# Patient Record
Sex: Female | Born: 1940 | Race: White | Hispanic: No | Smoking: Never smoker
Health system: Southern US, Community
[De-identification: ages and names within clinical notes are randomized; demographics above are authoritative.]

## PROBLEM LIST (undated history)

## (undated) DIAGNOSIS — K219 Gastro-esophageal reflux disease without esophagitis: Secondary | ICD-10-CM

## (undated) DIAGNOSIS — I10 Essential (primary) hypertension: Secondary | ICD-10-CM

## (undated) DIAGNOSIS — E785 Hyperlipidemia, unspecified: Secondary | ICD-10-CM

## (undated) HISTORY — DX: Gastro-esophageal reflux disease without esophagitis: K21.9

## (undated) HISTORY — PX: ABDOMINAL HYSTERECTOMY: SHX81

## (undated) HISTORY — DX: Essential (primary) hypertension: I10

## (undated) HISTORY — DX: Hyperlipidemia, unspecified: E78.5

---

## 2010-03-01 ENCOUNTER — Ambulatory Visit (HOSPITAL_COMMUNITY): Admission: RE | Admit: 2010-03-01 | Discharge: 2010-03-01 | Payer: Self-pay | Admitting: Ophthalmology

## 2013-01-30 ENCOUNTER — Ambulatory Visit (INDEPENDENT_AMBULATORY_CARE_PROVIDER_SITE_OTHER): Payer: Medicare Other | Admitting: Nurse Practitioner

## 2013-01-30 ENCOUNTER — Encounter: Payer: Self-pay | Admitting: Nurse Practitioner

## 2013-01-30 ENCOUNTER — Telehealth: Payer: Self-pay | Admitting: Nurse Practitioner

## 2013-01-30 VITALS — BP 136/76 | HR 90 | Temp 97.2°F | Ht 64.0 in | Wt 170.0 lb

## 2013-01-30 DIAGNOSIS — H6120 Impacted cerumen, unspecified ear: Secondary | ICD-10-CM

## 2013-01-30 DIAGNOSIS — H6123 Impacted cerumen, bilateral: Secondary | ICD-10-CM

## 2013-01-30 MED ORDER — TRAMADOL HCL 50 MG PO TABS: 50.0000 mg | ORAL_TABLET | Freq: Three times a day (TID) | ORAL | Status: DC | PRN

## 2013-01-30 NOTE — Patient Instructions (Signed)
Cerumen Impaction A cerumen impaction is when the wax in your ear forms a plug. This plug usually causes reduced hearing. Sometimes it also causes an earache or dizziness. Removing a cerumen impaction can be difficult and painful. The wax sticks to the ear canal. The canal is sensitive and bleeds easily. If you try to remove a heavy wax buildup with a cotton tipped swab, you may push it in further. Irrigation with water, suction, and small ear curettes may be used to clear out the wax. If the impaction is fixed to the skin in the ear canal, ear drops may be needed for a few days to loosen the wax. People who build up a lot of wax frequently can use ear wax removal products available in your local drugstore. SEEK MEDICAL CARE IF:  You develop an earache, increased hearing loss, or marked dizziness. Document Released: 09/06/2004 Document Revised: 10/22/2011 Document Reviewed: 10/27/2009 Exit[employer] Patient Information 2014 [employer].  

## 2013-01-30 NOTE — Telephone Encounter (Signed)
appt made

## 2013-01-30 NOTE — Progress Notes (Addendum)
Subjective:    Patient ID: Alyssa Ashley, female    DOB: [birth year], 72 y.o.   MRN: [id number]  HPI Patient in c/o left ear pain- started 2 evenings ago- nothing helps    Review of Systems  Constitutional: Positive for fever.  HENT: Positive for ear pain (left). Negative for congestion, rhinorrhea, sneezing, mouth sores and sinus pressure.   Eyes: Negative.   Respiratory: Negative for cough.   Cardiovascular: Negative.   Gastrointestinal: Negative.   Genitourinary: Negative.   Musculoskeletal: Negative.   Psychiatric/Behavioral: Negative.        Objective:   Physical Exam  Constitutional: She appears well-developed and well-nourished.  HENT:  bil cerumen impaction  Cardiovascular: Normal rate and normal heart sounds.   Pulmonary/Chest: Effort normal and breath sounds normal.  Skin: Skin is warm.  Psychiatric: She has a normal mood and affect. Her behavior is normal. Judgment and thought content normal.     Irrigation attempted without success-m currette used and small amount extracted BP 136/76  Pulse 90  Temp(Src) 97.2 F (36.2 C) (Oral)  Ht 5\' 4"  (1.626 m)  Wt 170 lb (77.111 kg)  BMI 29.17 kg/m2      Assessment & Plan:  bil cerumen impaction  Debrox OTC  RTO Monday to reflush  Ultram 50 mg 1 po Q* prn- #40 0 refills   Mary-Margaret Daphine Deutscher, FNP

## 2013-04-11 ENCOUNTER — Other Ambulatory Visit: Payer: Self-pay | Admitting: Nurse Practitioner

## 2013-09-06 ENCOUNTER — Other Ambulatory Visit: Payer: Self-pay | Admitting: Family Medicine

## 2013-09-08 NOTE — Telephone Encounter (Signed)
ntbs

## 2013-09-08 NOTE — Telephone Encounter (Signed)
Last seen 01/30/13  MMM 

## 2013-10-08 ENCOUNTER — Other Ambulatory Visit: Payer: Self-pay | Admitting: Nurse Practitioner

## 2013-10-10 ENCOUNTER — Other Ambulatory Visit: Payer: Self-pay | Admitting: Nurse Practitioner

## 2013-10-13 NOTE — Telephone Encounter (Signed)
Patient NTBS for follow up and lab work  

## 2013-10-13 NOTE — Telephone Encounter (Signed)
Last seen 01/30/13  MMM 

## 2013-10-14 NOTE — Telephone Encounter (Signed)
Cell / home # disconnected. Pt no longer works at the [phone]. Left message with Foye Spurlingatricia Gann [phone] to have [facility] call to make an appointment

## 2013-11-10 ENCOUNTER — Ambulatory Visit (INDEPENDENT_AMBULATORY_CARE_PROVIDER_SITE_OTHER): Payer: Medicare HMO

## 2013-11-10 ENCOUNTER — Other Ambulatory Visit: Payer: Self-pay | Admitting: Family Medicine

## 2013-11-10 ENCOUNTER — Ambulatory Visit (HOSPITAL_COMMUNITY)
Admission: RE | Admit: 2013-11-10 | Discharge: 2013-11-10 | Disposition: A | Discharge status: Home / Self Care | Payer: Medicare HMO | Source: Ambulatory Visit | Attending: Family Medicine | Admitting: Family Medicine

## 2013-11-10 ENCOUNTER — Ambulatory Visit (INDEPENDENT_AMBULATORY_CARE_PROVIDER_SITE_OTHER): Payer: Medicare HMO | Admitting: Family Medicine

## 2013-11-10 VITALS — BP 136/86 | HR 105 | Temp 97.0°F | Ht 64.0 in | Wt 168.0 lb

## 2013-11-10 DIAGNOSIS — M25569 Pain in unspecified knee: Secondary | ICD-10-CM

## 2013-11-10 DIAGNOSIS — M7120 Synovial cyst of popliteal space [Baker], unspecified knee: Secondary | ICD-10-CM | POA: Insufficient documentation

## 2013-11-10 MED ORDER — LISINOPRIL-HYDROCHLOROTHIAZIDE 10-12.5 MG PO TABS: 1.0000 | ORAL_TABLET | Freq: Every day | ORAL | Status: DC

## 2013-11-10 MED ORDER — TRAMADOL HCL 50 MG PO TABS: 50.0000 mg | ORAL_TABLET | Freq: Three times a day (TID) | ORAL | Status: DC | PRN

## 2013-11-10 MED ORDER — PREDNISONE 50 MG PO TABS: ORAL_TABLET | ORAL | Status: DC

## 2013-11-10 NOTE — Progress Notes (Signed)
Subjective:    Patient ID: Alyssa Ashley, female    DOB: [birth year], 73 y.o.   MRN: [id number]  HPI Knee pain x3-4 days  Woke up over the weekend with generalized knee pain and mild swelling No known injury Prior hx/o knee OA No knee locking or giving away.  Also with some mild popliteal pain  Neurovasculary intact distally     Review of Systems  All other systems reviewed and are negative.       Objective:   Physical Exam  Constitutional: She appears well-developed and well-nourished.  HENT:  Head: Normocephalic and atraumatic.  Eyes: Conjunctivae are normal. Pupils are equal, round, and reactive to light.  Neck: Normal range of motion.  Cardiovascular: Normal rate and regular rhythm.   Pulmonary/Chest: Effort normal.  Abdominal: Soft.  Musculoskeletal:       Legs:    [facility] reading (PRIMARY) by  Dr. Alvester Morin  Preliminary R knee xray with mild joint space narrowing and degenerative changes                                        Assessment & Plan:  Knee pain - Plan: DG Knee 1-2 Views Right, predniSONE (DELTASONE) 50 MG tablet, traMADol (ULTRAM) 50 MG tablet, AMB referral to sports medicine, [place] Misc Soft Tissue   Will place on course of prednisone and ultram LE U/S to rule out DVT/bakers cyst.  Plan for sports medicine follow up for ultrasound guided drainage of knee if needed s/p course of prednisone/pending imaging.  Discussed [facility] red flags.  Follow up as needed.

## 2013-11-10 NOTE — Addendum Note (Signed)
Addended by: Gwenith DailyHUDY, Jericka Kadar N on: 11/10/2013 12:45 PM   Modules accepted: Orders

## 2013-12-07 ENCOUNTER — Encounter: Payer: Self-pay | Admitting: Nurse Practitioner

## 2013-12-07 ENCOUNTER — Ambulatory Visit (INDEPENDENT_AMBULATORY_CARE_PROVIDER_SITE_OTHER): Payer: Medicare HMO | Admitting: Nurse Practitioner

## 2013-12-07 VITALS — BP 125/80 | HR 106 | Temp 97.1°F | Ht 64.0 in | Wt 158.0 lb

## 2013-12-07 DIAGNOSIS — B372 Candidiasis of skin and nail: Secondary | ICD-10-CM

## 2013-12-07 DIAGNOSIS — R209 Unspecified disturbances of skin sensation: Secondary | ICD-10-CM

## 2013-12-07 DIAGNOSIS — I10 Essential (primary) hypertension: Secondary | ICD-10-CM

## 2013-12-07 DIAGNOSIS — R208 Other disturbances of skin sensation: Secondary | ICD-10-CM

## 2013-12-07 DIAGNOSIS — R200 Anesthesia of skin: Secondary | ICD-10-CM

## 2013-12-07 MED ORDER — FLUCONAZOLE 150 MG PO TABS: 150.0000 mg | ORAL_TABLET | Freq: Once | ORAL | Status: DC

## 2013-12-07 MED ORDER — NYSTATIN 100000 UNIT/GM EX CREA: 1.0000 "application " | TOPICAL_CREAM | Freq: Two times a day (BID) | CUTANEOUS | Status: DC

## 2013-12-07 NOTE — Patient Instructions (Signed)

## 2013-12-07 NOTE — Progress Notes (Signed)
Subjective:    Patient ID: Alyssa Ashley, female    DOB: 1941-08-08, 73 y.o.   MRN: [id number]  Hypertension This is a chronic problem. The current episode started more than 1 year ago. The problem is unchanged. The problem is controlled. Pertinent negatives include no blurred vision, chest pain, headaches, palpitations, peripheral edema or shortness of breath. There are no associated agents to hypertension. Risk factors for coronary artery disease include post-menopausal state and sedentary lifestyle. Past treatments include ACE inhibitors and diuretics. The current treatment provides moderate improvement. Compliance problems include diet and exercise.    * C/O vaginal itching- Started a few days ago- no discharge * Patient was walking across living room floor and now foot is numb- not sure what is going on with it. Can't feel it at all when she is walking.   Review of Systems  Constitutional: Negative.   HENT: Negative.   Eyes: Negative for blurred vision.  Respiratory: Negative for shortness of breath.   Cardiovascular: Negative for chest pain and palpitations.  Gastrointestinal: Negative.   Genitourinary: Negative.   Neurological: Negative for facial asymmetry and headaches.  Psychiatric/Behavioral: Negative.         Objective:   Physical Exam  Constitutional: She is oriented to person, place, and time. She appears well-developed and well-nourished.  Cardiovascular: Normal rate, regular rhythm and normal heart sounds.   Pulmonary/Chest: Effort normal and breath sounds normal.  Abdominal: Soft. Bowel sounds are normal.  Genitourinary:  Erythematous external gentalia  Musculoskeletal:  Patient unable to flex foot upward- positive extension of foot against resisitance Unable to feel light touch but able to feel deep touch No foot drop with gait  Neurological: She is alert and oriented to person, place, and time.  Skin: Skin is warm and dry.  Psychiatric: She has a normal mood  and affect. Her behavior is normal. Judgment and thought content normal.   BP 125/80  Pulse 106  Temp(Src) 97.1 F (36.2 C) (Oral)  Ht 5' 4" (1.626 m)  Wt 158 lb (71.668 kg)  BMI 27.11 kg/m2        Assessment & Plan:  1. Hypertension Continue current meds Low NA+ diet - CMP14+EGFR - NMR, lipoprofile  2. Numbness of left foot Keep diary - Ambulatory referral to Neurology  3. Cutaneous candidiasis Avoid scratching No bubble baths - nystatin cream (MYCOSTATIN); Apply 1 application topically 2 (two) times daily.  Dispense: 30 g; Refill: 0 - fluconazole (DIFLUCAN) 150 MG tablet; Take 1 tablet (150 mg total) by mouth once.  Dispense: 1 tablet; Refill: 0   Health maintenance reviewed RTO in 3 months  [place], FNP  Subjective:    Patient ID: Alyssa Ashley, female    DOB: 1941-08-08, 73 y.o.   MRN: [id number]  Hypertension This is a chronic problem. The current episode started more than 1 year ago. The problem is unchanged. The problem is controlled. Pertinent negatives include no blurred vision, chest pain, headaches, palpitations, peripheral edema or shortness of breath. There are no associated agents to hypertension. Risk factors for coronary artery disease include post-menopausal state and sedentary lifestyle. Past treatments include ACE inhibitors and diuretics. The current treatment provides moderate improvement. Compliance problems include diet and exercise.    * C/O vaginal itching- Started a few days ago- no discharge * Patient was walking across living room floor and now foot is numb- not sure what is going on with it. Can't feel it at all when she is walking.   Review of Systems  Constitutional: Negative.   HENT: Negative.   Eyes: Negative for blurred vision.  Respiratory: Negative for shortness of breath.   Cardiovascular: Negative for chest pain and palpitations.  Gastrointestinal: Negative.   Genitourinary: Negative.   Neurological: Negative for facial asymmetry and headaches.  Psychiatric/Behavioral: Negative.         Objective:   Physical Exam  Constitutional: She is oriented to person, place, and time. She appears well-developed and well-nourished.  Cardiovascular: Normal rate, regular rhythm and normal heart sounds.   Pulmonary/Chest: Effort normal and breath sounds normal.  Abdominal: Soft. Bowel sounds are normal.  Genitourinary:  Erythematous external gentalia  Musculoskeletal:  Patient unable to flex foot upward- positive extension of foot against resisitance Unable to feel light touch but able to feel deep touch No foot drop with gait  Neurological: She is alert and oriented to person, place, and time.  Skin: Skin is warm and dry.  Psychiatric: She has a normal mood  and affect. Her behavior is normal. Judgment and thought content normal.   BP 125/80  Pulse 106  Temp(Src) 97.1 F (36.2 C) (Oral)  Ht 5' 4" (1.626 m)  Wt 158 lb (71.668 kg)  BMI 27.11 kg/m2        Assessment & Plan:  1. Hypertension Continue current meds Low NA+ diet - CMP14+EGFR - NMR, lipoprofile  2. Numbness of left foot Keep diary - Ambulatory referral to Neurology  3. Cutaneous candidiasis Avoid scratching No bubble baths - nystatin cream (MYCOSTATIN); Apply 1 application topically 2 (two) times daily.  Dispense: 30 g; Refill: 0 - fluconazole (DIFLUCAN) 150 MG tablet; Take 1 tablet (150 mg total) by mouth once.  Dispense: 1 tablet; Refill: 0   Health maintenance reviewed RTO in 3 months  [place], FNP

## 2013-12-08 LAB — NMR, LIPOPROFILE
CHOLESTEROL: 299 mg/dL — AB (ref <200)
HDL CHOLESTEROL BY NMR: 42 mg/dL (ref >=40)
HDL Particle Number: 40.6 umol/L (ref >=30.5)
LDL PARTICLE NUMBER: 1730 nmol/L — AB (ref <1000)
LDL Size: 19.6 nm (ref >20.5)
LP-IR Score: 73 — ABNORMAL HIGH (ref <=45)
SMALL LDL PARTICLE NUMBER: 1504 nmol/L — AB (ref <=527)
TRIGLYCERIDES BY NMR: 949 mg/dL — AB (ref <150)

## 2013-12-08 LAB — CMP14+EGFR
A/G RATIO: 2 (ref 1.1–2.5)
ALBUMIN: 4.1 g/dL (ref 3.5–4.8)
ALT: 34 IU/L — ABNORMAL HIGH (ref 0–32)
AST: 21 IU/L (ref 0–40)
Alkaline Phosphatase: 94 IU/L (ref 39–117)
BILIRUBIN TOTAL: 0.4 mg/dL (ref 0.0–1.2)
BUN/Creatinine Ratio: 22 (ref 11–26)
BUN: 22 mg/dL (ref 8–27)
CALCIUM: 10.2 mg/dL (ref 8.7–10.3)
CO2: 20 mmol/L (ref 18–29)
CREATININE: 1 mg/dL (ref 0.57–1.00)
Chloride: 79 mmol/L — ABNORMAL LOW (ref 97–108)
GFR, EST AFRICAN AMERICAN: 65 mL/min/{1.73_m2} (ref >59)
GFR, EST NON AFRICAN AMERICAN: 56 mL/min/{1.73_m2} — AB (ref >59)
GLUCOSE: 652 mg/dL — AB (ref 65–99)
Globulin, Total: 2.1 g/dL (ref 1.5–4.5)
POTASSIUM: 5 mmol/L (ref 3.5–5.2)
SODIUM: 122 mmol/L — AB (ref 134–144)
Total Protein: 6.2 g/dL (ref 6.0–8.5)

## 2014-01-11 ENCOUNTER — Telehealth: Payer: Self-pay | Admitting: Nurse Practitioner

## 2014-01-14 ENCOUNTER — Ambulatory Visit (INDEPENDENT_AMBULATORY_CARE_PROVIDER_SITE_OTHER): Payer: Medicare HMO | Admitting: Nurse Practitioner

## 2014-01-14 ENCOUNTER — Telehealth: Payer: Self-pay | Admitting: Nurse Practitioner

## 2014-01-14 ENCOUNTER — Encounter: Payer: Self-pay | Admitting: Nurse Practitioner

## 2014-01-14 VITALS — BP 119/73 | HR 85 | Temp 97.2°F | Ht 64.0 in | Wt 157.6 lb

## 2014-01-14 DIAGNOSIS — IMO0001 Reserved for inherently not codable concepts without codable children: Secondary | ICD-10-CM

## 2014-01-14 DIAGNOSIS — E1165 Type 2 diabetes mellitus with hyperglycemia: Principal | ICD-10-CM

## 2014-01-14 DIAGNOSIS — E119 Type 2 diabetes mellitus without complications: Secondary | ICD-10-CM | POA: Insufficient documentation

## 2014-01-14 DIAGNOSIS — Z09 Encounter for follow-up examination after completed treatment for conditions other than malignant neoplasm: Secondary | ICD-10-CM

## 2014-01-14 LAB — POCT GLYCOSYLATED HEMOGLOBIN (HGB A1C): Hemoglobin A1C: 10

## 2014-01-14 LAB — GLUCOSE, POCT (MANUAL RESULT ENTRY): POC GLUCOSE: 117 mg/dL — AB (ref 70–99)

## 2014-01-14 MED ORDER — METFORMIN HCL 500 MG PO TABS: 1000.0000 mg | ORAL_TABLET | Freq: Two times a day (BID) | ORAL | Status: DC

## 2014-01-14 NOTE — Progress Notes (Signed)
Subjective:    Patient ID: Alyssa Ashley, female    DOB: 1941/06/05, 73 y.o.   MRN: [id number]  HPI Patient was seen on 12/07/13 for reglar follow up- When we got her lab results back her blood sugar was over 600- SHe was immediately called and was sent to the ER- She was kept in the hospital for 3 days- they rx her insulin and metformin 500 2 BID- Patient said that she could not afford insulin and did not get x filled. Has just been taking metformin- Her fasting blood sugars are running 120-130's since coming home from the hospital. [facility] has been watching her diet.    Review of Systems  Constitutional: Negative.   HENT: Negative.   Eyes: Negative.   Respiratory: Negative.   Cardiovascular: Negative.   Gastrointestinal: Negative.   Genitourinary: Negative.   Neurological: Negative.   Psychiatric/Behavioral: Negative.   All other systems reviewed and are negative.      Objective:   Physical Exam  Constitutional: She is oriented to person, place, and time. She appears well-developed and well-nourished.  Cardiovascular: Normal rate, regular rhythm and normal heart sounds.   Pulmonary/Chest: Effort normal and breath sounds normal.  Neurological: She is alert and oriented to person, place, and time.  Skin: Skin is warm and dry.  Psychiatric: She has a normal mood and affect. Her behavior is normal. Judgment and thought content normal.   BP 119/73  Pulse 85  Temp(Src) 97.2 F (36.2 C) (Oral)  Ht 5\' 4"  (1.626 m)  Wt 157 lb 9.6 oz (71.487 kg)  BMI 27.04 kg/m2   Results for orders placed in visit on 01/14/14  POCT GLYCOSYLATED HEMOGLOBIN (HGB A1C)      Result Value Ref Range   Hemoglobin A1C 10.0    GLUCOSE, POCT (MANUAL RESULT ENTRY)      Result Value Ref Range   POC Glucose 117 (*) 70 - 99 mg/dl         Assessment & Plan:   1. Type II or unspecified type diabetes mellitus without mention of complication, uncontrolled   2. Hospital discharge follow-up     Continue  metformin as rx Carb counting RTO in 1 month with blood sugar diary - POCT glycosylated hemoglobin (Hb A1C) - POCT glucose (manual entry) - metFORMIN (GLUCOPHAGE) 500 MG tablet; Take 2 tablets (1,000 mg total) by mouth 2 (two) times daily with a meal.  Dispense: 180 tablet; Refill: 1  Mary-Margaret Daphine Deutscher, FNP

## 2014-01-14 NOTE — Telephone Encounter (Signed)
appt given for today at 3 

## 2014-01-14 NOTE — Telephone Encounter (Signed)
Papers done by Debbi on 01/14/14

## 2014-01-14 NOTE — Patient Instructions (Signed)

## 2014-01-15 ENCOUNTER — Ambulatory Visit: Payer: Medicare HMO | Admitting: Neurology

## 2014-02-22 ENCOUNTER — Ambulatory Visit (INDEPENDENT_AMBULATORY_CARE_PROVIDER_SITE_OTHER): Payer: Medicare HMO | Admitting: Nurse Practitioner

## 2014-02-22 ENCOUNTER — Encounter: Payer: Self-pay | Admitting: Nurse Practitioner

## 2014-02-22 VITALS — BP 122/72 | HR 94 | Temp 98.3°F | Ht 64.0 in | Wt 152.8 lb

## 2014-02-22 DIAGNOSIS — E1165 Type 2 diabetes mellitus with hyperglycemia: Principal | ICD-10-CM

## 2014-02-22 DIAGNOSIS — I10 Essential (primary) hypertension: Secondary | ICD-10-CM

## 2014-02-22 DIAGNOSIS — IMO0001 Reserved for inherently not codable concepts without codable children: Secondary | ICD-10-CM

## 2014-02-22 LAB — POCT GLYCOSYLATED HEMOGLOBIN (HGB A1C): Hemoglobin A1C: 7.7

## 2014-02-22 NOTE — Patient Instructions (Signed)

## 2014-02-22 NOTE — Progress Notes (Signed)
Subjective:    Patient ID: Alyssa Ashley, female    DOB: April 23, 1941, 73 y.o.   MRN: [id number]  Patient here today for follow up of chronic medical problems.   Diabetes Pertinent negatives for hypoglycemia include no headaches. Pertinent negatives for diabetes include no blurred vision and no chest pain.  Hypertension This is a chronic problem. The current episode started more than 1 year ago. The problem is unchanged. The problem is controlled. Pertinent negatives include no blurred vision, chest pain, headaches, palpitations, peripheral edema or shortness of breath. There are no associated agents to hypertension. Risk factors for coronary artery disease include post-menopausal state and sedentary lifestyle. Past treatments include ACE inhibitors and diuretics. The current treatment provides moderate improvement. Compliance problems include diet and exercise.   GERD Patient taking nexium and that is working well for her.   Review of Systems  Constitutional: Negative.   HENT: Negative.   Eyes: Negative for blurred vision.  Respiratory: Negative for shortness of breath.   Cardiovascular: Negative for chest pain and palpitations.  Gastrointestinal: Negative.   Genitourinary: Negative.   Neurological: Negative for facial asymmetry and headaches.  Psychiatric/Behavioral: Negative.         Objective:   Physical Exam  Constitutional: She is oriented to person, place, and time. She appears well-developed and well-nourished.  Cardiovascular: Normal rate, regular rhythm and normal heart sounds.   Pulmonary/Chest: Effort normal and breath sounds normal.  Abdominal: Soft. Bowel sounds are normal.  Genitourinary:  Erythematous external gentalia  Musculoskeletal:  Patient unable to flex foot upward- positive extension of foot against resisitance Unable to feel light touch but able to feel deep touch No foot drop with gait  Neurological: She is alert and oriented to person, place, and  time.  Skin: Skin is warm and dry.  Contusion dorsal surface of right foot- nontender to touch  Psychiatric: She has a normal mood and affect. Her behavior is normal. Judgment and thought content normal.   BP 122/72  Pulse 94  Temp(Src) 98.3 F (36.8 C) (Oral)  Ht 5\' 4"  (1.626 m)  Wt 152 lb 12.8 oz (69.31 kg)  BMI 26.22 kg/m2  Results for orders placed in visit on 02/22/14  POCT GLYCOSYLATED HEMOGLOBIN (HGB A1C)      Result Value Ref Range   Hemoglobin A1C 7.7           Assessment & Plan:   1. Type II or unspecified type diabetes mellitus without mention of complication, uncontrolled   2. Essential hypertension    Orders Placed This Encounter  Procedures  . POCT glycosylated hemoglobin (Hb A1C)   Meds ordered this encounter  Medications  . esomeprazole (NEXIUM) 20 MG capsule    Sig: Take 20 mg by mouth daily at 12 noon.   hgba1c improving- form >10 tp 7.7- will continue current meds and recheck in 3 months Labs pending Health maintenance reviewed Diet and exercise encouraged Continue all meds Follow up  In 3 months   Mary-Margaret Daphine Deutscher, FNP

## 2014-02-23 LAB — CMP14+EGFR
A/G RATIO: 1.8 (ref 1.1–2.5)
ALBUMIN: 4.2 g/dL (ref 3.5–4.8)
ALT: 12 IU/L (ref 0–32)
AST: 11 IU/L (ref 0–40)
Alkaline Phosphatase: 60 IU/L (ref 39–117)
BUN/Creatinine Ratio: 23 (ref 11–26)
BUN: 17 mg/dL (ref 8–27)
CHLORIDE: 100 mmol/L (ref 97–108)
CO2: 23 mmol/L (ref 18–29)
Calcium: 10 mg/dL (ref 8.7–10.3)
Creatinine, Ser: 0.75 mg/dL (ref 0.57–1.00)
GFR calc Af Amer: 91 mL/min/{1.73_m2} (ref >59)
GFR, EST NON AFRICAN AMERICAN: 79 mL/min/{1.73_m2} (ref >59)
Globulin, Total: 2.4 g/dL (ref 1.5–4.5)
Glucose: 117 mg/dL — ABNORMAL HIGH (ref 65–99)
Potassium: 3.5 mmol/L (ref 3.5–5.2)
Sodium: 144 mmol/L (ref 134–144)
Total Protein: 6.6 g/dL (ref 6.0–8.5)

## 2014-02-23 LAB — NMR, LIPOPROFILE
CHOLESTEROL: 166 mg/dL (ref 100–199)
HDL Cholesterol by NMR: 47 mg/dL (ref >39)
HDL PARTICLE NUMBER: 36.6 umol/L (ref >=30.5)
LDL Particle Number: 1107 nmol/L — ABNORMAL HIGH (ref <1000)
LDL Size: 20.1 nm (ref >20.5)
LDLC SERPL CALC-MCNC: 76 mg/dL (ref 0–99)
LP-IR SCORE: 63 — AB (ref <=45)
Small LDL Particle Number: 665 nmol/L — ABNORMAL HIGH (ref <=527)
Triglycerides by NMR: 216 mg/dL — ABNORMAL HIGH (ref 0–149)

## 2014-02-25 ENCOUNTER — Telehealth: Payer: Self-pay | Admitting: *Deleted

## 2014-02-25 NOTE — Telephone Encounter (Signed)
Message copied by Baltazar ApoJOYCE, Rhiley Solem W on Thu Feb 25, 2014 11:11 AM ------      Message from: Bennie PieriniMARTIN, MARY-MARGARET      Created: Tue Feb 23, 2014  8:10 AM       Kidney and liver function stable      ldl particle numbers lok great- trig are improved      Continue current meds- low fat diet and exercise and recheck in 3 months       ------

## 2014-03-02 ENCOUNTER — Encounter: Payer: Self-pay | Admitting: Nurse Practitioner

## 2014-04-15 ENCOUNTER — Telehealth: Payer: Self-pay | Admitting: Nurse Practitioner

## 2014-04-15 ENCOUNTER — Encounter: Payer: Self-pay | Admitting: *Deleted

## 2014-05-04 ENCOUNTER — Other Ambulatory Visit: Payer: Self-pay | Admitting: Nurse Practitioner

## 2014-05-05 ENCOUNTER — Other Ambulatory Visit: Payer: Self-pay | Admitting: Nurse Practitioner

## 2014-05-26 ENCOUNTER — Ambulatory Visit: Payer: Medicare HMO | Admitting: Nurse Practitioner

## 2014-05-27 ENCOUNTER — Telehealth: Payer: Self-pay | Admitting: *Deleted

## 2014-05-27 ENCOUNTER — Encounter: Payer: Self-pay | Admitting: *Deleted

## 2014-05-27 NOTE — Telephone Encounter (Signed)
i need to know hy patient needs in order to do paper work.

## 2014-05-27 NOTE — Telephone Encounter (Signed)
Please discuss knee brace that [facility] says mrs Alyssa Ashley has ordered. If she did in reality order this and you wish her to persue I have the form. Thank you!

## 2014-05-28 ENCOUNTER — Ambulatory Visit: Payer: Medicare HMO | Admitting: Nurse Practitioner

## 2014-06-23 ENCOUNTER — Other Ambulatory Visit: Payer: Self-pay | Admitting: Nurse Practitioner

## 2014-07-01 ENCOUNTER — Ambulatory Visit (INDEPENDENT_AMBULATORY_CARE_PROVIDER_SITE_OTHER): Payer: Medicare HMO | Admitting: Nurse Practitioner

## 2014-07-01 ENCOUNTER — Encounter: Payer: Self-pay | Admitting: Nurse Practitioner

## 2014-07-01 VITALS — BP 123/79 | HR 86 | Temp 97.5°F | Ht 64.0 in | Wt 151.0 lb

## 2014-07-01 DIAGNOSIS — I10 Essential (primary) hypertension: Secondary | ICD-10-CM

## 2014-07-01 DIAGNOSIS — K219 Gastro-esophageal reflux disease without esophagitis: Secondary | ICD-10-CM

## 2014-07-01 DIAGNOSIS — E119 Type 2 diabetes mellitus without complications: Secondary | ICD-10-CM

## 2014-07-01 LAB — POCT GLYCOSYLATED HEMOGLOBIN (HGB A1C): Hemoglobin A1C: 6.7

## 2014-07-01 MED ORDER — LISINOPRIL-HYDROCHLOROTHIAZIDE 10-12.5 MG PO TABS: 1.0000 | ORAL_TABLET | Freq: Every day | ORAL | Status: DC

## 2014-07-01 MED ORDER — METFORMIN HCL 500 MG PO TABS: ORAL_TABLET | ORAL | Status: DC

## 2014-07-01 MED ORDER — ESOMEPRAZOLE MAGNESIUM 20 MG PO CPDR: 20.0000 mg | DELAYED_RELEASE_CAPSULE | Freq: Every day | ORAL | Status: DC

## 2014-07-01 NOTE — Patient Instructions (Signed)

## 2014-07-01 NOTE — Progress Notes (Signed)
Subjective:    Patient ID: Royetta Crochet, female    DOB: Jun 11, 1941, 73 y.o.   MRN: [id number]  Patient here today for follow up of chronic medical problems.   Diabetes She presents for her follow-up diabetic visit. She has type 2 diabetes mellitus. No MedicAlert identification noted. There are no hypoglycemic associated symptoms. Pertinent negatives for hypoglycemia include no headaches. Associated symptoms include foot paresthesias. Pertinent negatives for diabetes include no chest pain, no polydipsia, no polyphagia and no polyuria. There are no hypoglycemic complications. There are no diabetic complications. Risk factors for coronary artery disease include diabetes mellitus, hypertension and post-menopausal. Current diabetic treatment includes oral agent (monotherapy). She is compliant with treatment most of the time. Her weight is stable. She is following a diabetic diet. When asked about meal planning, she reported none. She has not had a previous visit with a dietitian. She rarely participates in exercise. Her breakfast blood glucose is taken between 8-9 am. Her breakfast blood glucose range is generally 110-130 mg/dl. Her overall blood glucose range is 110-130 mg/dl. An ACE inhibitor/angiotensin II receptor blocker is being taken. She does not see a podiatrist.Eye exam is not current.  Hypertension This is a chronic problem. The current episode started more than 1 year ago. The problem is unchanged. The problem is controlled. Pertinent negatives include no chest pain, headaches, palpitations or shortness of breath. Risk factors for coronary artery disease include diabetes mellitus and post-menopausal state. Past treatments include ACE inhibitors and diuretics. The current treatment provides significant improvement. Compliance problems include diet and exercise.  Hypertensive end-organ damage includes CAD/MI.  GERD Patient taking nexium and that is working well for her.   Review of Systems    Constitutional: Negative.   HENT: Negative.   Respiratory: Negative for shortness of breath.   Cardiovascular: Negative for chest pain and palpitations.  Gastrointestinal: Negative.   Endocrine: Negative for polydipsia, polyphagia and polyuria.  Genitourinary: Negative.   Neurological: Negative for facial asymmetry and headaches.  Psychiatric/Behavioral: Negative.         Objective:   Physical Exam  Constitutional: She is oriented to person, place, and time. She appears well-developed and well-nourished.  HENT:  Nose: Nose normal.  Mouth/Throat: Oropharynx is clear and moist.  Eyes: EOM are normal.  Neck: Trachea normal, normal range of motion and full passive range of motion without pain. Neck supple. No JVD present. Carotid bruit is not present. No thyromegaly present.  Cardiovascular: Normal rate, regular rhythm, normal heart sounds and intact distal pulses.  Exam reveals no gallop and no friction rub.   No murmur heard. Pulmonary/Chest: Effort normal and breath sounds normal.  Abdominal: Soft. Bowel sounds are normal. She exhibits no distension and no mass. There is no tenderness.  Musculoskeletal: Normal range of motion.  Lymphadenopathy:    She has no cervical adenopathy.  Neurological: She is alert and oriented to person, place, and time. She has normal reflexes.  Skin: Skin is warm and dry.  Psychiatric: She has a normal mood and affect. Her behavior is normal. Judgment and thought content normal.   BP 123/79 mmHg  Pulse 86  Temp(Src) 97.5 F (36.4 C) (Oral)  Ht 5\' 4"  (1.626 m)  Wt 151 lb (68.493 kg)  BMI 25.91 kg/m2  Results for orders placed or performed in visit on 07/01/14  POCT glycosylated hemoglobin (Hb A1C)  Result Value Ref Range   Hemoglobin A1C 6.7%          Assessment & Plan:  1. Essential hypertension Do not add salt to diet - CMP14+EGFR - NMR, lipoprofile - lisinopril-hydrochlorothiazide (PRINZIDE,ZESTORETIC) 10-12.5 MG per tablet; Take  1 tablet by mouth daily.  Dispense: 90 tablet; Refill: 1  2. Type 2 diabetes mellitus without complication Watch carbs - POCT glycosylated hemoglobin (Hb A1C) - metFORMIN (GLUCOPHAGE) 500 MG tablet; TAKE TWO TABLETS BY MOUTH TWICE DAILY WITH MEALS  Dispense: 180 tablet; Refill: 1  3. Gastroesophageal reflux disease without esophagitis Avoid spicy foods Do  Not eat 2 hours prior to bedtime - esomeprazole (NEXIUM) 20 MG capsule; Take 1 capsule (20 mg total) by mouth daily at 12 noon.  Dispense: 90 capsule; Refill: 1   Refuses all immunizations Labs pending Health maintenance reviewed Diet and exercise encouraged Continue all meds Follow up  In 3 months   Mary-Margaret Daphine Deutscher, FNP

## 2014-07-02 LAB — CMP14+EGFR
A/G RATIO: 1.9 (ref 1.1–2.5)
ALK PHOS: 63 IU/L (ref 39–117)
ALT: 8 IU/L (ref 0–32)
AST: 8 IU/L (ref 0–40)
Albumin: 4.3 g/dL (ref 3.5–4.8)
BILIRUBIN TOTAL: 0.2 mg/dL (ref 0.0–1.2)
BUN / CREAT RATIO: 18 (ref 11–26)
BUN: 12 mg/dL (ref 8–27)
CO2: 26 mmol/L (ref 18–29)
Calcium: 9.4 mg/dL (ref 8.7–10.3)
Chloride: 98 mmol/L (ref 97–108)
Creatinine, Ser: 0.67 mg/dL (ref 0.57–1.00)
GFR, EST AFRICAN AMERICAN: 100 mL/min/{1.73_m2} (ref >59)
GFR, EST NON AFRICAN AMERICAN: 87 mL/min/{1.73_m2} (ref >59)
Globulin, Total: 2.3 g/dL (ref 1.5–4.5)
Glucose: 105 mg/dL — ABNORMAL HIGH (ref 65–99)
Potassium: 4.2 mmol/L (ref 3.5–5.2)
Sodium: 139 mmol/L (ref 134–144)
Total Protein: 6.6 g/dL (ref 6.0–8.5)

## 2014-07-02 LAB — NMR, LIPOPROFILE
CHOLESTEROL: 202 mg/dL — AB (ref 100–199)
HDL CHOLESTEROL BY NMR: 50 mg/dL (ref >39)
HDL Particle Number: 34.9 umol/L (ref >=30.5)
LDL Particle Number: 1477 nmol/L — ABNORMAL HIGH (ref <1000)
LDL Size: 20.4 nm (ref >20.5)
LDL-C: 110 mg/dL — ABNORMAL HIGH (ref 0–99)
LP-IR Score: 63 — ABNORMAL HIGH (ref <=45)
SMALL LDL PARTICLE NUMBER: 779 nmol/L — AB (ref <=527)
TRIGLYCERIDES BY NMR: 209 mg/dL — AB (ref 0–149)

## 2014-07-06 ENCOUNTER — Telehealth: Payer: Self-pay | Admitting: Nurse Practitioner

## 2014-07-06 NOTE — Telephone Encounter (Signed)
Aware of lab results.  Does not want to take cholesterol medication.

## 2014-07-06 NOTE — Telephone Encounter (Signed)
-----   Message from Bennie PieriniMary-Margaret Martin, FNP sent at 07/05/2014  1:19 PM EST ----- Hgba1c discussed at appointment Kidney and liver function stable LDL are increasing- needs to be on a statin- will Take? Continue current meds- low fat diet and exercise and recheck in 3 months

## 2014-12-20 ENCOUNTER — Other Ambulatory Visit: Payer: Self-pay | Admitting: Nurse Practitioner

## 2015-01-05 ENCOUNTER — Encounter: Payer: Self-pay | Admitting: Nurse Practitioner

## 2015-01-05 ENCOUNTER — Ambulatory Visit (INDEPENDENT_AMBULATORY_CARE_PROVIDER_SITE_OTHER): Payer: Commercial Managed Care - HMO | Admitting: Nurse Practitioner

## 2015-01-05 VITALS — BP 123/80 | HR 85 | Temp 97.1°F | Ht 64.0 in | Wt 156.0 lb

## 2015-01-05 DIAGNOSIS — E119 Type 2 diabetes mellitus without complications: Secondary | ICD-10-CM | POA: Diagnosis not present

## 2015-01-05 DIAGNOSIS — K219 Gastro-esophageal reflux disease without esophagitis: Secondary | ICD-10-CM | POA: Diagnosis not present

## 2015-01-05 DIAGNOSIS — I10 Essential (primary) hypertension: Secondary | ICD-10-CM | POA: Diagnosis not present

## 2015-01-05 LAB — POCT GLYCOSYLATED HEMOGLOBIN (HGB A1C): Hemoglobin A1C: 6.8

## 2015-01-05 MED ORDER — LISINOPRIL-HYDROCHLOROTHIAZIDE 10-12.5 MG PO TABS: 1.0000 | ORAL_TABLET | Freq: Every day | ORAL | Status: DC

## 2015-01-05 MED ORDER — METFORMIN HCL 500 MG PO TABS: ORAL_TABLET | ORAL | Status: DC

## 2015-01-05 MED ORDER — ESOMEPRAZOLE MAGNESIUM 20 MG PO CPDR: 20.0000 mg | DELAYED_RELEASE_CAPSULE | Freq: Every day | ORAL | Status: DC

## 2015-01-05 NOTE — Addendum Note (Signed)
Addended by: Tommas OlpHANDY, Brondon Wann N on: 01/05/2015 11:23 AM   Modules accepted: Orders

## 2015-01-05 NOTE — Patient Instructions (Signed)

## 2015-01-05 NOTE — Progress Notes (Signed)
Subjective:    Patient ID: Alyssa Ashley, female    DOB: [birth year], 74 y.o.   MRN: [id number]  Patient here today for follow up of chronic medical problems.   Hypertension This is a chronic problem. The current episode started more than 1 year ago. The problem is unchanged. The problem is controlled. Pertinent negatives include no chest pain, headaches, palpitations or shortness of breath. Risk factors for coronary artery disease include diabetes mellitus and post-menopausal state. Past treatments include ACE inhibitors and diuretics. The current treatment provides significant improvement. Compliance problems include diet and exercise.  Hypertensive end-organ damage includes CAD/MI.  Diabetes She presents for her follow-up diabetic visit. She has type 2 diabetes mellitus. No MedicAlert identification noted. There are no hypoglycemic associated symptoms. Pertinent negatives for hypoglycemia include no headaches. Associated symptoms include foot paresthesias. Pertinent negatives for diabetes include no chest pain, no polydipsia, no polyphagia and no polyuria. There are no hypoglycemic complications. There are no diabetic complications. Risk factors for coronary artery disease include diabetes mellitus, hypertension and post-menopausal. Current diabetic treatment includes oral agent (monotherapy). She is compliant with treatment most of the time. Her weight is stable. She is following a diabetic diet. When asked about meal planning, she reported none. She has not had a previous visit with a dietitian. She rarely participates in exercise. Her breakfast blood glucose is taken between 8-9 am. Her breakfast blood glucose range is generally 110-130 mg/dl. Her overall blood glucose range is 110-130 mg/dl. An ACE inhibitor/angiotensin II receptor blocker is being taken. She does not see a podiatrist.Eye exam is not current.  GERD Patient taking nexium and that is working well for her.   Review of Systems    Constitutional: Negative.   HENT: Negative.   Respiratory: Negative for shortness of breath.   Cardiovascular: Negative for chest pain and palpitations.  Gastrointestinal: Negative.   Endocrine: Negative for polydipsia, polyphagia and polyuria.  Genitourinary: Negative.   Neurological: Negative for facial asymmetry and headaches.  Psychiatric/Behavioral: Negative.         Objective:   Physical Exam  Constitutional: She is oriented to person, place, and time. She appears well-developed and well-nourished.  HENT:  Nose: Nose normal.  Mouth/Throat: Oropharynx is clear and moist.  Eyes: EOM are normal.  Neck: Trachea normal, normal range of motion and full passive range of motion without pain. Neck supple. No JVD present. Carotid bruit is not present. No thyromegaly present.  Cardiovascular: Normal rate, regular rhythm, normal heart sounds and intact distal pulses.  Exam reveals no gallop and no friction rub.   No murmur heard. Pulmonary/Chest: Effort normal and breath sounds normal.  Abdominal: Soft. Bowel sounds are normal. She exhibits no distension and no mass. There is no tenderness.  Musculoskeletal: Normal range of motion.  Lymphadenopathy:    She has no cervical adenopathy.  Neurological: She is alert and oriented to person, place, and time. She has normal reflexes.  Skin: Skin is warm and dry.  Psychiatric: She has a normal mood and affect. Her behavior is normal. Judgment and thought content normal.   BP 123/80 mmHg  Pulse 85  Temp(Src) 97.1 F (36.2 C) (Oral)  Ht 5\' 4"  (1.626 m)  Wt 156 lb (70.761 kg)  BMI 26.76 kg/m2  Results for orders placed or performed in visit on 01/05/15  POCT glycosylated hemoglobin (Hb A1C)  Result Value Ref Range   Hemoglobin A1C 6.8          Assessment & Plan:  1. Essential hypertension Do not add salt to diet - POCT glycosylated hemoglobin (Hb A1C) - CMP14+EGFR - Microalbumin, urine - lisinopril-hydrochlorothiazide  (PRINZIDE,ZESTORETIC) 10-12.5 MG per tablet; Take 1 tablet by mouth daily.  Dispense: 90 tablet; Refill: 1  2. Gastroesophageal reflux disease without esophagitis Avoid spicy foods Do not eat 2 hours prior to bedtime  - esomeprazole (NEXIUM) 20 MG capsule; Take 1 capsule (20 mg total) by mouth daily at 12 noon.  Dispense: 90 capsule; Refill: 1  3. Type 2 diabetes mellitus without complication Continue to watch carbs in diet - metFORMIN (GLUCOPHAGE) 500 MG tablet; TAKE TWO TABLETS BY MOUTH TWICE DAILY WITH MEALS  Dispense: 180 tablet; Refill: 1   Refuses EKG and chest x ray Labs pending Health maintenance reviewed- refuses all preventative  Diet and exercise encouraged Continue all meds Follow up  In 3 month   Mary-Margaret Daphine Deutscher, FNP

## 2015-01-06 LAB — CMP14+EGFR
A/G RATIO: 1.7 (ref 1.1–2.5)
ALBUMIN: 4 g/dL (ref 3.5–4.8)
ALK PHOS: 62 IU/L (ref 39–117)
ALT: 11 IU/L (ref 0–32)
AST: 13 IU/L (ref 0–40)
BILIRUBIN TOTAL: 0.2 mg/dL (ref 0.0–1.2)
BUN/Creatinine Ratio: 20 (ref 11–26)
BUN: 16 mg/dL (ref 8–27)
CALCIUM: 9.6 mg/dL (ref 8.7–10.3)
CHLORIDE: 100 mmol/L (ref 97–108)
CO2: 21 mmol/L (ref 18–29)
CREATININE: 0.79 mg/dL (ref 0.57–1.00)
GFR, EST AFRICAN AMERICAN: 85 mL/min/{1.73_m2} (ref >59)
GFR, EST NON AFRICAN AMERICAN: 74 mL/min/{1.73_m2} (ref >59)
GLOBULIN, TOTAL: 2.3 g/dL (ref 1.5–4.5)
Glucose: 126 mg/dL — ABNORMAL HIGH (ref 65–99)
Potassium: 4.4 mmol/L (ref 3.5–5.2)
SODIUM: 141 mmol/L (ref 134–144)
TOTAL PROTEIN: 6.3 g/dL (ref 6.0–8.5)

## 2015-03-09 DIAGNOSIS — H2513 Age-related nuclear cataract, bilateral: Secondary | ICD-10-CM | POA: Diagnosis not present

## 2015-03-09 DIAGNOSIS — E119 Type 2 diabetes mellitus without complications: Secondary | ICD-10-CM | POA: Diagnosis not present

## 2015-03-09 LAB — HM DIABETES EYE EXAM

## 2015-03-14 DIAGNOSIS — H4011X3 Primary open-angle glaucoma, severe stage: Secondary | ICD-10-CM | POA: Diagnosis not present

## 2015-03-28 DIAGNOSIS — H4011X3 Primary open-angle glaucoma, severe stage: Secondary | ICD-10-CM | POA: Diagnosis not present

## 2015-04-14 ENCOUNTER — Encounter: Payer: Self-pay | Admitting: *Deleted

## 2015-06-17 ENCOUNTER — Other Ambulatory Visit: Payer: Self-pay | Admitting: Nurse Practitioner

## 2015-06-24 ENCOUNTER — Ambulatory Visit (INDEPENDENT_AMBULATORY_CARE_PROVIDER_SITE_OTHER): Payer: Commercial Managed Care - HMO | Admitting: Family

## 2015-06-24 ENCOUNTER — Encounter: Payer: Self-pay | Admitting: Family

## 2015-06-24 ENCOUNTER — Ambulatory Visit: Payer: Commercial Managed Care - HMO | Admitting: Pediatrics

## 2015-06-24 VITALS — BP 120/74 | HR 101 | Temp 98.4°F | Ht 64.0 in | Wt 157.4 lb

## 2015-06-24 DIAGNOSIS — J069 Acute upper respiratory infection, unspecified: Secondary | ICD-10-CM | POA: Diagnosis not present

## 2015-06-24 MED ORDER — HYDROCODONE-HOMATROPINE 5-1.5 MG/5ML PO SYRP: 5.0000 mL | ORAL_SOLUTION | Freq: Three times a day (TID) | ORAL | Status: DC | PRN

## 2015-06-24 MED ORDER — FLUTICASONE PROPIONATE 50 MCG/ACT NA SUSP: 2.0000 | Freq: Every day | NASAL | Status: DC

## 2015-06-24 MED ORDER — METHYLPREDNISOLONE 4 MG PO TBPK: ORAL_TABLET | ORAL | Status: DC

## 2015-06-24 MED ORDER — BENZONATATE 200 MG PO CAPS: 200.0000 mg | ORAL_CAPSULE | Freq: Three times a day (TID) | ORAL | Status: DC | PRN

## 2015-06-24 NOTE — Progress Notes (Signed)
Subjective:    Patient ID: Alyssa Ashley, female    DOB: [birth year], 74 y.o.   MRN: [id number]  Cough This is a new problem. The current episode started 1 to 4 weeks ago. The problem has been gradually worsening. The problem occurs every few minutes. The cough is productive of purulent sputum. Associated symptoms include chills, a fever, headaches, myalgias, nasal congestion, postnasal drip, rhinorrhea and a sore throat. Pertinent negatives include no ear congestion, ear pain or shortness of breath. The symptoms are aggravated by lying down. She has tried OTC cough suppressant for the symptoms. The treatment provided moderate relief. There is no history of asthma or COPD.  Sore Throat  Associated symptoms include coughing and headaches. Pertinent negatives include no ear pain or shortness of breath.      Review of Systems  Constitutional: Positive for fever and chills.  HENT: Positive for postnasal drip, rhinorrhea and sore throat. Negative for ear pain.   Eyes: Negative.   Respiratory: Positive for cough. Negative for shortness of breath.   Cardiovascular: Negative.  Negative for palpitations.  Gastrointestinal: Negative.   Endocrine: Negative.   Genitourinary: Negative.   Musculoskeletal: Positive for myalgias.  Neurological: Positive for headaches.  Hematological: Negative.   Psychiatric/Behavioral: Negative.   All other systems reviewed and are negative.      Objective:   Physical Exam  Constitutional: She is oriented to person, place, and time. She appears well-developed and well-nourished. No distress.  HENT:  Head: Normocephalic and atraumatic.  Right Ear: External ear normal.  Left Ear: External ear normal.  Mouth/Throat: Oropharynx is clear and moist.  Eyes: Pupils are equal, round, and reactive to light.  Neck: Normal range of motion. Neck supple. No thyromegaly present.  Cardiovascular: Normal rate, regular rhythm, normal heart sounds and intact distal pulses.   No  murmur heard. Pulmonary/Chest: Effort normal and breath sounds normal. No respiratory distress. She has no wheezes.  Abdominal: Soft. Bowel sounds are normal. She exhibits no distension. There is no tenderness.  Musculoskeletal: Normal range of motion. She exhibits no edema or tenderness.  Neurological: She is alert and oriented to person, place, and time. She has normal reflexes. No cranial nerve deficit.  Skin: Skin is warm and dry.  Psychiatric: She has a normal mood and affect. Her behavior is normal. Judgment and thought content normal.  Vitals reviewed.     BP 120/74 mmHg  Pulse 101  Temp(Src) 98.4 F (36.9 C) (Oral)  Ht 5\' 4"  (1.626 m)  Wt 157 lb 6.4 oz (71.396 kg)  BMI 27.00 kg/m2     Assessment & Plan:  1. Acute upper respiratory infection - Take meds as prescribed - Use a cool mist humidifier  -Use saline nose sprays frequently -Saline irrigations of the nose can be very helpful if done frequently.  * 4X daily for 1 week*  * Use of a nettie pot can be helpful with this. Follow directions with this* -Force fluids -For any cough or congestion  Use plain Mucinex- regular strength or max strength is fine   * Children- consult with Pharmacist for dosing -For fever or aces or pains- take tylenol or ibuprofen appropriate for age and weight.  * for fevers greater than 101 orally you may alternate ibuprofen and tylenol every  3 hours. -Throat lozenges if help -Low carb diet- Pt states blood glucose levels are WNL - benzonatate (TESSALON) 200 MG capsule; Take 1 capsule (200 mg total) by mouth 3 (three) times daily as  needed.  Dispense: 30 capsule; Refill: 1 - fluticasone (FLONASE) 50 MCG/ACT nasal spray; Place 2 sprays into both nostrils daily.  Dispense: 16 g; Refill: 6 - HYDROcodone-homatropine (HYCODAN) 5-1.5 MG/5ML syrup; Take 5 mLs by mouth every 8 (eight) hours as needed for cough.  Dispense: 120 mL; Refill: 0 - methylPREDNISolone (MEDROL DOSEPAK) 4 MG TBPK tablet; Use  as directed  Dispense: 21 tablet; Refill: 0  Jannifer Rodney, FNP

## 2015-06-24 NOTE — Patient Instructions (Signed)
Upper Respiratory Infection, Adult Most upper respiratory infections (URIs) are a viral infection of the air passages leading to the lungs. A URI affects the nose, throat, and upper air passages. The most common type of URI is nasopharyngitis and is typically referred to as "the common cold." URIs run their course and usually go away on their own. Most of the time, a URI does not require medical attention, but sometimes a bacterial infection in the upper airways can follow a viral infection. This is called a secondary infection. Sinus and middle ear infections are common types of secondary upper respiratory infections. Bacterial pneumonia can also complicate a URI. A URI can worsen asthma and chronic obstructive pulmonary disease (COPD). Sometimes, these complications can require emergency medical care and may be life threatening.  CAUSES Almost all URIs are caused by viruses. A virus is a type of germ and can spread from one person to another.  RISKS FACTORS You may be at risk for a URI if:   You smoke.   You have chronic heart or lung disease.  You have a weakened defense (immune) system.   You are very young or very old.   You have nasal allergies or asthma.  You work in crowded or poorly ventilated areas.  You work in health care facilities or schools. SIGNS AND SYMPTOMS  Symptoms typically develop 2-3 days after you come in contact with a cold virus. Most viral URIs last 7-10 days. However, viral URIs from the influenza virus (flu virus) can last 14-18 days and are typically more severe. Symptoms may include:   Runny or stuffy (congested) nose.   Sneezing.   Cough.   Sore throat.   Headache.   Fatigue.   Fever.   Loss of appetite.   Pain in your forehead, behind your eyes, and over your cheekbones (sinus pain).  Muscle aches.  DIAGNOSIS  Your health care provider may diagnose a URI by:  Physical exam.  Tests to check that your symptoms are not due to  another condition such as:  Strep throat.  Sinusitis.  Pneumonia.  Asthma. TREATMENT  A URI goes away on its own with time. It cannot be cured with medicines, but medicines may be prescribed or recommended to relieve symptoms. Medicines may help:  Reduce your fever.  Reduce your cough.  Relieve nasal congestion. HOME CARE INSTRUCTIONS   Take medicines only as directed by your health care provider.   Gargle warm saltwater or take cough drops to comfort your throat as directed by your health care provider.  Use a warm mist humidifier or inhale steam from a shower to increase air moisture. This may make it easier to breathe.  Drink enough fluid to keep your urine clear or pale yellow.   Eat soups and other clear broths and maintain good nutrition.   Rest as needed.   Return to work when your temperature has returned to normal or as your health care provider advises. You may need to stay home longer to avoid infecting others. You can also use a face mask and careful hand washing to prevent spread of the virus.  Increase the usage of your inhaler if you have asthma.   Do not use any tobacco products, including cigarettes, chewing tobacco, or electronic cigarettes. If you need help quitting, ask your health care provider. PREVENTION  The best way to protect yourself from getting a cold is to practice good hygiene.   Avoid oral or hand contact with people with cold   symptoms.   Wash your hands often if contact occurs.  There is no clear evidence that vitamin C, vitamin E, echinacea, or exercise reduces the chance of developing a cold. However, it is always recommended to get plenty of rest, exercise, and practice good nutrition.  SEEK MEDICAL CARE IF:   You are getting worse rather than better.   Your symptoms are not controlled by medicine.   You have chills.  You have worsening shortness of breath.  You have brown or red mucus.  You have yellow or brown nasal  discharge.  You have pain in your face, especially when you bend forward.  You have a fever.  You have swollen neck glands.  You have pain while swallowing.  You have white areas in the back of your throat. SEEK IMMEDIATE MEDICAL CARE IF:   You have severe or persistent:  Headache.  Ear pain.  Sinus pain.  Chest pain.  You have chronic lung disease and any of the following:  Wheezing.  Prolonged cough.  Coughing up blood.  A change in your usual mucus.  You have a stiff neck.  You have changes in your:  Vision.  Hearing.  Thinking.  Mood. MAKE SURE YOU:   Understand these instructions.  Will watch your condition.  Will get help right away if you are not doing well or get worse.   This information is not intended to replace advice given to you by your health care provider. Make sure you discuss any questions you have with your health care provider.   Document Released: 01/23/2001 Document Revised: 12/14/2014 Document Reviewed: 11/04/2013 Elsevier Interactive Patient Education 2016 [employer]  - Take meds as prescribed - Use a cool mist humidifier  -Use saline nose sprays frequently -Saline irrigations of the nose can be very helpful if done frequently.  * 4X daily for 1 week*  * Use of a nettie pot can be helpful with this. Follow directions with this* -Force fluids -For any cough or congestion  Use plain Mucinex- regular strength or max strength is fine   * Children- consult with Pharmacist for dosing -For fever or aces or pains- take tylenol or ibuprofen appropriate for age and weight.  * for fevers greater than 101 orally you may alternate ibuprofen and tylenol every  3 hours. -Throat lozenges if help   Christy Hawks, FNP   

## 2015-06-27 ENCOUNTER — Ambulatory Visit (INDEPENDENT_AMBULATORY_CARE_PROVIDER_SITE_OTHER): Payer: Commercial Managed Care - HMO

## 2015-06-27 ENCOUNTER — Encounter: Payer: Self-pay | Admitting: Nurse Practitioner

## 2015-06-27 ENCOUNTER — Ambulatory Visit (INDEPENDENT_AMBULATORY_CARE_PROVIDER_SITE_OTHER): Payer: Commercial Managed Care - HMO | Admitting: Nurse Practitioner

## 2015-06-27 VITALS — BP 133/76 | HR 112 | Temp 96.9°F | Ht 64.0 in | Wt 156.0 lb

## 2015-06-27 DIAGNOSIS — E119 Type 2 diabetes mellitus without complications: Secondary | ICD-10-CM | POA: Diagnosis not present

## 2015-06-27 DIAGNOSIS — Z87891 Personal history of nicotine dependence: Secondary | ICD-10-CM

## 2015-06-27 DIAGNOSIS — Z6826 Body mass index (BMI) 26.0-26.9, adult: Secondary | ICD-10-CM | POA: Diagnosis not present

## 2015-06-27 DIAGNOSIS — I10 Essential (primary) hypertension: Secondary | ICD-10-CM

## 2015-06-27 DIAGNOSIS — K219 Gastro-esophageal reflux disease without esophagitis: Secondary | ICD-10-CM | POA: Diagnosis not present

## 2015-06-27 DIAGNOSIS — Z720 Tobacco use: Secondary | ICD-10-CM

## 2015-06-27 LAB — POCT GLYCOSYLATED HEMOGLOBIN (HGB A1C): HEMOGLOBIN A1C: 7.1

## 2015-06-27 MED ORDER — METFORMIN HCL 500 MG PO TABS: ORAL_TABLET | ORAL | Status: DC

## 2015-06-27 MED ORDER — ESOMEPRAZOLE MAGNESIUM 20 MG PO CPDR: 20.0000 mg | DELAYED_RELEASE_CAPSULE | Freq: Every day | ORAL | Status: DC

## 2015-06-27 MED ORDER — LISINOPRIL-HYDROCHLOROTHIAZIDE 10-12.5 MG PO TABS: 1.0000 | ORAL_TABLET | Freq: Every day | ORAL | Status: DC

## 2015-06-27 NOTE — Progress Notes (Signed)
Subjective:    Patient ID: Alyssa Ashley, female    DOB: [birth year], 74 y.o.   MRN: [id number]  Patient here today for follow up of chronic medical problems.   Hypertension This is a chronic problem. The current episode started more than 1 year ago. The problem is unchanged. The problem is controlled. Pertinent negatives include no chest pain, headaches, palpitations or shortness of breath. Risk factors for coronary artery disease include diabetes mellitus and post-menopausal state. Past treatments include ACE inhibitors and diuretics. The current treatment provides significant improvement. Compliance problems include diet and exercise.  Hypertensive end-organ damage includes CAD/MI.  Diabetes She presents for her follow-up diabetic visit. She has type 2 diabetes mellitus. No MedicAlert identification noted. There are no hypoglycemic associated symptoms. Pertinent negatives for hypoglycemia include no headaches. Associated symptoms include foot paresthesias. Pertinent negatives for diabetes include no chest pain, no polydipsia, no polyphagia and no polyuria. There are no hypoglycemic complications. There are no diabetic complications. Risk factors for coronary artery disease include diabetes mellitus, hypertension and post-menopausal. Current diabetic treatment includes oral agent (monotherapy). She is compliant with treatment most of the time. Her weight is stable. She is following a diabetic diet. When asked about meal planning, she reported none. She has not had a previous visit with a dietitian. She rarely participates in exercise. Her breakfast blood glucose is taken between 8-9 am. Her breakfast blood glucose range is generally 110-130 mg/dl. Her overall blood glucose range is 110-130 mg/dl. An ACE inhibitor/angiotensin II receptor blocker is being taken. She does not see a podiatrist.Eye exam is not current.  GERD Patient taking nexium and that is working well for her.   Review of Systems    Constitutional: Negative.   HENT: Negative.   Respiratory: Negative for shortness of breath.   Cardiovascular: Negative for chest pain and palpitations.  Gastrointestinal: Negative.   Endocrine: Negative for polydipsia, polyphagia and polyuria.  Genitourinary: Negative.   Neurological: Negative for facial asymmetry and headaches.  Psychiatric/Behavioral: Negative.         Objective:   Physical Exam  Constitutional: She is oriented to person, place, and time. She appears well-developed and well-nourished.  HENT:  Nose: Nose normal.  Mouth/Throat: Oropharynx is clear and moist.  Eyes: EOM are normal.  Neck: Trachea normal, normal range of motion and full passive range of motion without pain. Neck supple. No JVD present. Carotid bruit is not present. No thyromegaly present.  Cardiovascular: Normal rate, regular rhythm, normal heart sounds and intact distal pulses.  Exam reveals no gallop and no friction rub.   No murmur heard. Pulmonary/Chest: Effort normal and breath sounds normal.  Abdominal: Soft. Bowel sounds are normal. She exhibits no distension and no mass. There is no tenderness.  Musculoskeletal: Normal range of motion.  Lymphadenopathy:    She has no cervical adenopathy.  Neurological: She is alert and oriented to person, place, and time. She has normal reflexes.  Skin: Skin is warm and dry.  Psychiatric: She has a normal mood and affect. Her behavior is normal. Judgment and thought content normal.   BP 133/76 mmHg  Pulse 112  Temp(Src) 96.9 F (36.1 C) (Oral)  Ht 5\' 4"  (1.626 m)  Wt 156 lb (70.761 kg)  BMI 26.76 kg/m2   Results for orders placed or performed in visit on 06/27/15  POCT glycosylated hemoglobin (Hb A1C)  Result Value Ref Range   Hemoglobin A1C 7.1    Chest x ray- no cardiopulmonary abnormalities noted- Preliminary reading  by Paulene Floor, FNP  [facility] EKG- [facility]-Mary-Margaret Daphine Deutscher, FNP      Assessment & Plan:   1. Essential hypertension Do not  add salt to diet - CMP14+EGFR - lisinopril-hydrochlorothiazide (PRINZIDE,ZESTORETIC) 10-12.5 MG tablet; Take 1 tablet by mouth daily.  Dispense: 90 tablet; Refill: 1  2. Type 2 diabetes mellitus without complication, without long-term current use of insulin (HCC) Continue to watch carbs in diet - POCT glycosylated hemoglobin (Hb A1C) - Lipid panel - Microalbumin, urine - metFORMIN (GLUCOPHAGE) 500 MG tablet; TAKE TWO TABLETS BY MOUTH TWICE DAILY WITH MEALS  Dispense: 180 tablet; Refill: 1  3. Gastroesophageal reflux disease without esophagitis Avoid spicy foods Do not eat 2 hours prior to bedtime - esomeprazole (NEXIUM) 20 MG capsule; Take 1 capsule (20 mg total) by mouth daily at 12 noon.  Dispense: 90 capsule; Refill: 1  4. BMI 26.0-26.9,adult Discussed diet and exercise for person with BMI >25 Will recheck weight in 3-6 months     Labs pending Health maintenance reviewed Diet and exercise encouraged Continue all meds Follow up  In 3 months   Mary-Margaret Daphine Deutscher, FNP

## 2015-06-27 NOTE — Addendum Note (Signed)
Addended by: Tommas OlpHANDY, Kidada Ging N on: 06/27/2015 11:01 AM   Modules accepted: Orders

## 2015-06-27 NOTE — Patient Instructions (Signed)
Fall Prevention in the Home  Falls can cause injuries and can affect people from all age groups. There are many simple things that you can do to make your home safe and to help prevent falls. WHAT CAN I DO ON THE OUTSIDE OF MY HOME?  Regularly repair the edges of walkways and driveways and fix any cracks.  Remove high doorway thresholds.  Trim any shrubbery on the main path into your home.  Use bright outdoor lighting.  Clear walkways of debris and clutter, including tools and rocks.  Regularly check that handrails are securely fastened and in good repair. Both sides of any steps should have handrails.  Install guardrails along the edges of any raised decks or porches.  Have leaves, snow, and ice cleared regularly.  Use sand or salt on walkways during winter months.  In the garage, clean up any spills right away, including grease or oil spills. WHAT CAN I DO IN THE BATHROOM?  Use night lights.  Install grab bars by the toilet and in the tub and shower. Do not use towel bars as grab bars.  Use non-skid mats or decals on the floor of the tub or shower.  If you need to sit down while you are in the shower, use a plastic, non-slip stool..  Keep the floor dry. Immediately clean up any water that spills on the floor.  Remove soap buildup in the tub or shower on a regular basis.  Attach bath mats securely with double-sided non-slip rug tape.  Remove throw rugs and other tripping hazards from the floor. WHAT CAN I DO IN THE BEDROOM?  Use night lights.  Make sure that a bedside light is easy to reach.  Do not use oversized bedding that drapes onto the floor.  Have a firm chair that has side arms to use for getting dressed.  Remove throw rugs and other tripping hazards from the floor. WHAT CAN I DO IN THE KITCHEN?   Clean up any spills right away.  Avoid walking on wet floors.  Place frequently used items in easy-to-reach places.  If you need to reach for something  above you, use a sturdy step stool that has a grab bar.  Keep electrical cables out of the way.  Do not use floor polish or wax that makes floors slippery. If you have to use wax, make sure that it is non-skid floor wax.  Remove throw rugs and other tripping hazards from the floor. WHAT CAN I DO IN THE STAIRWAYS?  Do not leave any items on the stairs.  Make sure that there are handrails on both sides of the stairs. Fix handrails that are broken or loose. Make sure that handrails are as long as the stairways.  Check any carpeting to make sure that it is firmly attached to the stairs. Fix any carpet that is loose or worn.  Avoid having throw rugs at the top or bottom of stairways, or secure the rugs with carpet tape to prevent them from moving.  Make sure that you have a light switch at the top of the stairs and the bottom of the stairs. If you do not have them, have them installed. WHAT ARE SOME OTHER FALL PREVENTION TIPS?  Wear closed-toe shoes that fit well and support your feet. Wear shoes that have rubber soles or low heels.  When you use a stepladder, make sure that it is completely opened and that the sides are firmly locked. Have someone hold the ladder while you   are using it. Do not climb a closed stepladder.  Add color or contrast paint or tape to grab bars and handrails in your home. Place contrasting color strips on the first and last steps.  Use mobility aids as needed, such as canes, walkers, scooters, and crutches.  Turn on lights if it is dark. Replace any light bulbs that burn out.  Set up furniture so that there are clear paths. Keep the furniture in the same spot.  Fix any uneven floor surfaces.  Choose a carpet design that does not hide the edge of steps of a stairway.  Be aware of any and all pets.  Review your medicines with your healthcare provider. Some medicines can cause dizziness or changes in blood pressure, which increase your risk of falling. Talk  with your health care provider about other ways that you can decrease your risk of falls. This may include working with a physical therapist or trainer to improve your strength, balance, and endurance.   This information is not intended to replace advice given to you by your health care provider. Make sure you discuss any questions you have with your health care provider.   Document Released: 07/20/2002 Document Revised: 12/14/2014 Document Reviewed: 09/03/2014 Elsevier Interactive Patient Education 2016 [employer]  

## 2015-06-28 LAB — CMP14+EGFR
ALT: 14 IU/L (ref 0–32)
AST: 13 IU/L (ref 0–40)
Albumin/Globulin Ratio: 1.6 (ref 1.1–2.5)
Albumin: 4.2 g/dL (ref 3.5–4.8)
Alkaline Phosphatase: 102 IU/L (ref 39–117)
BUN/Creatinine Ratio: 33 — ABNORMAL HIGH (ref 11–26)
BUN: 28 mg/dL — AB (ref 8–27)
Bilirubin Total: <0.2 mg/dL (ref 0.0–1.2)
CALCIUM: 9.9 mg/dL (ref 8.7–10.3)
CHLORIDE: 100 mmol/L (ref 97–106)
CO2: 24 mmol/L (ref 18–29)
Creatinine, Ser: 0.84 mg/dL (ref 0.57–1.00)
GFR, EST AFRICAN AMERICAN: 79 mL/min/{1.73_m2} (ref >59)
GFR, EST NON AFRICAN AMERICAN: 68 mL/min/{1.73_m2} (ref >59)
GLUCOSE: 158 mg/dL — AB (ref 65–99)
Globulin, Total: 2.7 g/dL (ref 1.5–4.5)
Potassium: 3.9 mmol/L (ref 3.5–5.2)
Sodium: 142 mmol/L (ref 136–144)
TOTAL PROTEIN: 6.9 g/dL (ref 6.0–8.5)

## 2015-06-28 LAB — LIPID PANEL
CHOL/HDL RATIO: 3.2 ratio (ref 0.0–4.4)
Cholesterol, Total: 159 mg/dL (ref 100–199)
HDL: 50 mg/dL (ref >39)
LDL Calculated: 65 mg/dL (ref 0–99)
TRIGLYCERIDES: 219 mg/dL — AB (ref 0–149)
VLDL CHOLESTEROL CAL: 44 mg/dL — AB (ref 5–40)

## 2015-06-30 ENCOUNTER — Encounter: Payer: Self-pay | Admitting: *Deleted

## 2015-07-10 DIAGNOSIS — E119 Type 2 diabetes mellitus without complications: Secondary | ICD-10-CM | POA: Diagnosis not present

## 2015-07-10 DIAGNOSIS — Z79899 Other long term (current) drug therapy: Secondary | ICD-10-CM | POA: Diagnosis not present

## 2015-07-10 DIAGNOSIS — Z7984 Long term (current) use of oral hypoglycemic drugs: Secondary | ICD-10-CM | POA: Diagnosis not present

## 2015-07-10 DIAGNOSIS — M19031 Primary osteoarthritis, right wrist: Secondary | ICD-10-CM | POA: Diagnosis not present

## 2015-07-10 DIAGNOSIS — Z882 Allergy status to sulfonamides status: Secondary | ICD-10-CM | POA: Diagnosis not present

## 2015-07-10 DIAGNOSIS — Z794 Long term (current) use of insulin: Secondary | ICD-10-CM | POA: Diagnosis not present

## 2015-07-10 DIAGNOSIS — Z888 Allergy status to other drugs, medicaments and biological substances status: Secondary | ICD-10-CM | POA: Diagnosis not present

## 2015-07-10 DIAGNOSIS — M7989 Other specified soft tissue disorders: Secondary | ICD-10-CM | POA: Diagnosis not present

## 2015-07-10 DIAGNOSIS — I10 Essential (primary) hypertension: Secondary | ICD-10-CM | POA: Diagnosis not present

## 2015-07-18 ENCOUNTER — Ambulatory Visit: Payer: Commercial Managed Care - HMO | Admitting: Family Medicine

## 2015-07-19 ENCOUNTER — Encounter: Payer: Self-pay | Admitting: Family Medicine

## 2015-07-19 ENCOUNTER — Ambulatory Visit (INDEPENDENT_AMBULATORY_CARE_PROVIDER_SITE_OTHER): Payer: Commercial Managed Care - HMO

## 2015-07-19 ENCOUNTER — Telehealth: Payer: Self-pay | Admitting: Family Medicine

## 2015-07-19 ENCOUNTER — Ambulatory Visit (INDEPENDENT_AMBULATORY_CARE_PROVIDER_SITE_OTHER): Payer: Commercial Managed Care - HMO | Admitting: Family Medicine

## 2015-07-19 VITALS — BP 110/71 | HR 92 | Temp 96.8°F | Ht 64.0 in | Wt 152.8 lb

## 2015-07-19 DIAGNOSIS — M25531 Pain in right wrist: Secondary | ICD-10-CM

## 2015-07-19 MED ORDER — TRAMADOL HCL 50 MG PO TABS: 50.0000 mg | ORAL_TABLET | Freq: Three times a day (TID) | ORAL | Status: DC | PRN

## 2015-07-19 NOTE — Patient Instructions (Signed)
Great to meet you!  Wear the wrist splint until we get back to you about your x ray results officially, then you can take it off and gently move it.   Try ice 10 to 15 minutes 2-3 times daily  Try tramadol for pain  We are arranging a referral to orthopedics.

## 2015-07-19 NOTE — Progress Notes (Signed)
   HPI  Patient presents today today for right wrist pain.  Patient explains that [facility] about [facility] weeks ago for this wrist pain. She had 3 days of symptoms at that time  She describes redness and swelling of the right wrist, x-rays were performed that day showed no acute fracture, but advanced arthritis as well as osteopenia.  Since that time she's been using hydrocodone for pain which has helped but not consistently, it does make her sleepy as well.  He denies any trauma or inciting events of the right wrist.  She denies any history of rheumatoid arthritis.    PMH: Smoking status noted ROS: Per HPI  Objective: BP 110/71 mmHg  Pulse 92  Temp(Src) 96.8 F (36 C) (Oral)  Ht 5\' 4"  (1.626 m)  Wt 152 lb 12.8 oz (69.31 kg)  BMI 26.22 kg/m2 Gen: NAD, alert, cooperative with exam HEENT: NCAT CV: RRR, good S1/S2, no murmur Resp: CTABL, no wheezes, non-labored Ext: No edema, warm Neuro: Alert and oriented, No gross deficits  Musculoskeletal: Right wrist swollen compared to the left, she does have tenderness diffusely around the wrist as well as the anatomic snuffbox, some pain with passive range of motion No significant redness or warmth She is wearing a thumb spica wrist splint  Also note that 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  Orders Placed This Encounter  Procedures  . DG Wrist Complete Right    Standing Status: Future     Number of Occurrences: 1     Standing Expiration Date: 09/18/2016    Order Specific Question:  Reason for Exam (SYMPTOM  OR DIAGNOSIS REQUIRED)    Answer:  R wrist pain    Order Specific Question:  Preferred imaging location?    Answer:  Internal  . Sedimentation rate  . ANA  . Rheumatoid factor  . AMB referral to orthopedics    Referral Priority:  Routine    Referral Type:  Consultation    Number of Visits Requested:  1    Meds ordered this encounter  Medications  . traMADol (ULTRAM) 50 MG tablet    Sig: Take 1 tablet (50 mg total) by mouth every 8 (eight) hours as needed.    Dispense:  60 tablet    Refill:  0    Murtis SinkSam Cristalle Rohm, MD [facility] 07/19/2015, 5:08 PM

## 2015-07-20 ENCOUNTER — Other Ambulatory Visit: Payer: Self-pay | Admitting: Family Medicine

## 2015-07-20 LAB — RHEUMATOID FACTOR: Rhuematoid fact SerPl-aCnc: 13.5 IU/mL (ref 0.0–13.9)

## 2015-07-20 LAB — ANA: ANA: POSITIVE — AB

## 2015-07-20 LAB — SEDIMENTATION RATE: SED RATE: 58 mm/h — AB (ref 0–40)

## 2015-07-20 MED ORDER — PREDNISONE 20 MG PO TABS: 40.0000 mg | ORAL_TABLET | Freq: Every day | ORAL | Status: DC

## 2015-07-20 NOTE — Telephone Encounter (Signed)
See result note  Short steroid course for possible inflammatory arthritis.   Elevated ESR and + ANA, negative RF. All nonspecific but given her constellation of symptoms it is reasonable to try.   If she has a good response I will send her to rheum for a more thorough lab work up.   Sam Bradshaw, MD [facility] 07/20/2015, 3:05 PM    

## 2015-07-20 NOTE — Telephone Encounter (Signed)
Result note sent to the pool.   Murtis SinkSam Aubrianne Molyneux, MD [facility] 07/20/2015, 12:21 PM     Her wrist xray does have severe arthritis but no broken bones.  She can come out of the splint and gently move the wrist, she can still wear the splint intermittently for comfort.  Labs pending.

## 2015-07-20 NOTE — Telephone Encounter (Signed)
Patient would like us to call daughter Alyssa Ashley with results [phone] when available.

## 2015-07-21 ENCOUNTER — Telehealth: Payer: Self-pay | Admitting: Family Medicine

## 2015-07-21 NOTE — Telephone Encounter (Signed)
Pt has 2 open calls.

## 2015-07-21 NOTE — Telephone Encounter (Signed)
Returning your call. °

## 2015-08-01 ENCOUNTER — Telehealth: Payer: Self-pay | Admitting: Nurse Practitioner

## 2015-08-01 NOTE — Telephone Encounter (Signed)
Pt aware of md feedback. Pt voiced understanding

## 2015-08-01 NOTE — Telephone Encounter (Signed)
No stronger meds without follow up, howevwer I wanted her to see ortho.   Looks like they haven't been able to contact her. [facility]   Murtis SinkSam Desirre Eickhoff, MD [facility] 08/01/2015, 10:37 AM

## 2015-08-02 ENCOUNTER — Telehealth: Payer: Self-pay | Admitting: Nurse Practitioner

## 2015-08-03 NOTE — Telephone Encounter (Signed)
Per Ermalinda MemosBradshaw patient needs a follow up appointment

## 2015-08-03 NOTE — Telephone Encounter (Signed)
Spoke with Alyssa SpurlingPatricia [facility] and we got an earlier appt for Friday, Dec 23 @ 3:00 to see Dr. Darrelyn HillockGioffre and referral is good.

## 2015-08-05 DIAGNOSIS — M19041 Primary osteoarthritis, right hand: Secondary | ICD-10-CM | POA: Diagnosis not present

## 2015-10-04 ENCOUNTER — Encounter: Payer: Self-pay | Admitting: Nurse Practitioner

## 2015-10-04 ENCOUNTER — Ambulatory Visit: Payer: Commercial Managed Care - HMO | Admitting: Nurse Practitioner

## 2015-10-04 ENCOUNTER — Ambulatory Visit (INDEPENDENT_AMBULATORY_CARE_PROVIDER_SITE_OTHER): Payer: Commercial Managed Care - HMO | Admitting: Nurse Practitioner

## 2015-10-04 VITALS — BP 130/82 | HR 88 | Temp 97.0°F | Ht 64.0 in | Wt 158.4 lb

## 2015-10-04 DIAGNOSIS — Z1212 Encounter for screening for malignant neoplasm of rectum: Secondary | ICD-10-CM | POA: Diagnosis not present

## 2015-10-04 DIAGNOSIS — K219 Gastro-esophageal reflux disease without esophagitis: Secondary | ICD-10-CM | POA: Diagnosis not present

## 2015-10-04 DIAGNOSIS — E119 Type 2 diabetes mellitus without complications: Secondary | ICD-10-CM | POA: Diagnosis not present

## 2015-10-04 DIAGNOSIS — I10 Essential (primary) hypertension: Secondary | ICD-10-CM | POA: Diagnosis not present

## 2015-10-04 LAB — POCT GLYCOSYLATED HEMOGLOBIN (HGB A1C): Hemoglobin A1C: 7.4

## 2015-10-04 MED ORDER — ESOMEPRAZOLE MAGNESIUM 20 MG PO CPDR: 20.0000 mg | DELAYED_RELEASE_CAPSULE | Freq: Every day | ORAL | Status: DC

## 2015-10-04 MED ORDER — METFORMIN HCL 500 MG PO TABS: ORAL_TABLET | ORAL | Status: DC

## 2015-10-04 MED ORDER — LISINOPRIL-HYDROCHLOROTHIAZIDE 10-12.5 MG PO TABS: 1.0000 | ORAL_TABLET | Freq: Every day | ORAL | Status: DC

## 2015-10-04 NOTE — Progress Notes (Signed)
Subjective:    Patient ID: Alyssa Ashley, female    DOB: 09/19/1940, 75 y.o.   MRN: [id number]  HPI    Review of Systems     Objective:   Physical Exam        Assessment & Plan:     Subjective:    Patient ID: Alyssa Ashley, female    DOB: 1941-04-13, 75 y.o.   MRN: [id number]  Patient here today for follow up of chronic medical problems.  Outpatient Encounter Prescriptions as of 10/04/2015  Medication Sig  . esomeprazole (NEXIUM) 20 MG capsule Take 1 capsule (20 mg total) by mouth daily at 12 noon.  Marland Kitchen lisinopril-hydrochlorothiazide (PRINZIDE,ZESTORETIC) 10-12.5 MG tablet Take 1 tablet by mouth daily.  . metFORMIN (GLUCOPHAGE) 500 MG tablet TAKE TWO TABLETS BY MOUTH TWICE DAILY WITH MEALS    Hypertension This is a chronic problem. The current episode started more than 1 year ago. The problem is unchanged. The problem is controlled. Pertinent negatives include no chest pain, headaches, palpitations or shortness of breath. Risk factors for coronary artery disease include diabetes mellitus and post-menopausal state. Past treatments include ACE inhibitors and diuretics. The current treatment provides significant improvement. Compliance problems include diet and exercise.  Hypertensive end-organ damage includes CAD/MI.  Diabetes She presents for her follow-up diabetic visit. She has type 2 diabetes mellitus. No MedicAlert identification noted. There are no hypoglycemic associated symptoms. Pertinent negatives for hypoglycemia include no headaches. Associated symptoms include foot paresthesias. Pertinent negatives for diabetes include no chest pain, no polydipsia, no polyphagia and no polyuria. There are no hypoglycemic complications. There are no diabetic complications. Risk factors for coronary artery disease include diabetes mellitus, hypertension and post-menopausal. Current diabetic treatment includes oral agent (monotherapy). She is compliant with treatment most of the time. Her  weight is stable. She is following a diabetic diet. When asked about meal planning, she reported none. She has not had a previous visit with a dietitian. She rarely participates in exercise. Her breakfast blood glucose is taken between 8-9 am. Her breakfast blood glucose range is generally 110-130 mg/dl. Her overall blood glucose range is 110-130 mg/dl. An ACE inhibitor/angiotensin II receptor blocker is being taken. She does not see a podiatrist.Eye exam is not current.  GERD Patient taking nexium and that is working well for her.   Review of Systems  Constitutional: Negative.   HENT: Negative.   Respiratory: Negative for shortness of breath.   Cardiovascular: Negative for chest pain and palpitations.  Gastrointestinal: Negative.   Endocrine: Negative for polydipsia, polyphagia and polyuria.  Genitourinary: Negative.   Neurological: Negative for facial asymmetry and headaches.  Psychiatric/Behavioral: Negative.         Objective:   Physical Exam  Constitutional: She is oriented to person, place, and time. She appears well-developed and well-nourished.  HENT:  Nose: Nose normal.  Mouth/Throat: Oropharynx is clear and moist.  Eyes: EOM are normal.  Neck: Trachea normal, normal range of motion and full passive range of motion without pain. Neck supple. No JVD present. Carotid bruit is not present. No thyromegaly present.  Cardiovascular: Normal rate, regular rhythm, normal heart sounds and intact distal pulses.  Exam reveals no gallop and no friction rub.   No murmur heard. Pulmonary/Chest: Effort normal and breath sounds normal.  Abdominal: Soft. Bowel sounds are normal. She exhibits no distension and no mass. There is no tenderness.  Musculoskeletal: Normal range of motion.  Lymphadenopathy:    She has no cervical adenopathy.  Neurological:  She is alert and oriented to person, place, and time. She has normal reflexes.  Skin: Skin is warm and dry.  Psychiatric: She has a normal  mood and affect. Her behavior is normal. Judgment and thought content normal.   BP 130/82 mmHg  Pulse 88  Temp(Src) 97 F (36.1 C) (Oral)  Ht 5\' 4"  (1.626 m)  Wt 158 lb 6.4 oz (71.85 kg)  BMI 27.18 kg/m2   Results for orders placed or performed in visit on 10/04/15  POCT glycosylated hemoglobin (Hb A1C)  Result Value Ref Range   Hemoglobin A1C 7.4         Assessment & Plan:  1. Type 2 diabetes mellitus without complication, without long-term current use of insulin (HCC) Stricter carb counting - POCT glycosylated hemoglobin (Hb A1C) - metFORMIN (GLUCOPHAGE) 500 MG tablet; TAKE TWO TABLETS BY MOUTH TWICE DAILY WITH MEALS  Dispense: 180 tablet; Refill: 1  2. Essential hypertension Do not add salt to diet - lisinopril-hydrochlorothiazide (PRINZIDE,ZESTORETIC) 10-12.5 MG tablet; Take 1 tablet by mouth daily.  Dispense: 90 tablet; Refill: 1  3. Gastroesophageal reflux disease without esophagitis Avoid spicy foods Do not eat 2 hours prior to bedtime - esomeprazole (NEXIUM) 20 MG capsule; Take 1 capsule (20 mg total) by mouth daily at 12 noon.  Dispense: 90 capsule; Refill: 1    Labs pending Health maintenance reviewed Diet and exercise encouraged Continue all meds Follow up  In 3 months   Mary-Margaret Daphine Deutscher, FNP

## 2015-10-04 NOTE — Patient Instructions (Signed)
Diabetes and Foot Care Diabetes may cause you to have problems because of poor blood supply (circulation) to your feet and legs. This may cause the skin on your feet to become thinner, break easier, and heal more slowly. Your skin may become dry, and the skin may peel and crack. You may also have nerve damage in your legs and feet causing decreased feeling in them. You may not notice minor injuries to your feet that could lead to infections or more serious problems. Taking care of your feet is one of the most important things you can do for yourself.  HOME CARE INSTRUCTIONS  Wear shoes at all times, even in the house. Do not go barefoot. Bare feet are easily injured.  Check your feet daily for blisters, cuts, and redness. If you cannot see the bottom of your feet, use a mirror or ask someone for help.  Wash your feet with warm water (do not use hot water) and mild soap. Then pat your feet and the areas between your toes until they are completely dry. Do not soak your feet as this can dry your skin.  Apply a moisturizing lotion or petroleum jelly (that does not contain alcohol and is unscented) to the skin on your feet and to dry, brittle toenails. Do not apply lotion between your toes.  Trim your toenails straight across. Do not dig under them or around the cuticle. File the edges of your nails with an emery board or nail file.  Do not cut corns or calluses or try to remove them with medicine.  Wear clean socks or stockings every day. Make sure they are not too tight. Do not wear knee-high stockings since they may decrease blood flow to your legs.  Wear shoes that fit properly and have enough cushioning. To break in new shoes, wear them for just a few hours a day. This prevents you from injuring your feet. Always look in your shoes before you put them on to be sure there are no objects inside.  Do not cross your legs. This may decrease the blood flow to your feet.  If you find a minor scrape,  cut, or break in the skin on your feet, keep it and the skin around it clean and dry. These areas may be cleansed with mild soap and water. Do not cleanse the area with peroxide, alcohol, or iodine.  When you remove an adhesive bandage, be sure not to damage the skin around it.  If you have a wound, look at it several times a day to make sure it is healing.  Do not use heating pads or hot water bottles. They may burn your skin. If you have lost feeling in your feet or legs, you may not know it is happening until it is too late.  Make sure your health care provider performs a complete foot exam at least annually or more often if you have foot problems. Report any cuts, sores, or bruises to your health care provider immediately. SEEK MEDICAL CARE IF:   You have an injury that is not healing.  You have cuts or breaks in the skin.  You have an ingrown nail.  You notice redness on your legs or feet.  You feel burning or tingling in your legs or feet.  You have pain or cramps in your legs and feet.  Your legs or feet are numb.  Your feet always feel cold. SEEK IMMEDIATE MEDICAL CARE IF:   There is increasing redness,   swelling, or pain in or around a wound.  There is a red line that goes up your leg.  Pus is coming from a wound.  You develop a fever or as directed by your health care provider.  You notice a bad smell coming from an ulcer or wound.   This information is not intended to replace advice given to you by your health care provider. Make sure you discuss any questions you have with your health care provider.   Document Released: 07/27/2000 Document Revised: 04/01/2013 Document Reviewed: 01/06/2013 Elsevier Interactive Patient Education 2016 [employer] Inc[employer]  

## 2015-10-04 NOTE — Addendum Note (Signed)
Addended by: Bennie Pierini on: 10/04/2015 04:54 PM   Modules accepted: Orders, SmartSet

## 2015-10-05 LAB — CMP14+EGFR
ALK PHOS: 69 IU/L (ref 39–117)
ALT: 12 IU/L (ref 0–32)
AST: 14 IU/L (ref 0–40)
Albumin/Globulin Ratio: 1.8 (ref 1.1–2.5)
Albumin: 4.2 g/dL (ref 3.5–4.8)
BUN/Creatinine Ratio: 22 (ref 11–26)
BUN: 16 mg/dL (ref 8–27)
Bilirubin Total: <0.2 mg/dL (ref 0.0–1.2)
CALCIUM: 9.6 mg/dL (ref 8.7–10.3)
CO2: 25 mmol/L (ref 18–29)
CREATININE: 0.72 mg/dL (ref 0.57–1.00)
Chloride: 97 mmol/L (ref 96–106)
GFR calc Af Amer: 95 mL/min/{1.73_m2} (ref >59)
GFR, EST NON AFRICAN AMERICAN: 82 mL/min/{1.73_m2} (ref >59)
GLOBULIN, TOTAL: 2.4 g/dL (ref 1.5–4.5)
GLUCOSE: 106 mg/dL — AB (ref 65–99)
Potassium: 4.4 mmol/L (ref 3.5–5.2)
SODIUM: 139 mmol/L (ref 134–144)
Total Protein: 6.6 g/dL (ref 6.0–8.5)

## 2015-10-05 LAB — LIPID PANEL
CHOL/HDL RATIO: 4 ratio (ref 0.0–4.4)
CHOLESTEROL TOTAL: 210 mg/dL — AB (ref 100–199)
HDL: 53 mg/dL (ref >39)
LDL CALC: 90 mg/dL (ref 0–99)
TRIGLYCERIDES: 335 mg/dL — AB (ref 0–149)
VLDL CHOLESTEROL CAL: 67 mg/dL — AB (ref 5–40)

## 2016-05-21 IMAGING — CR DG CHEST 2V
2 series · 2 of 2 positions shown · non-contrast
Comparison: Report of a chest x-ray dated December 08, 2013.

CLINICAL DATA: Cough and cold symptoms for the past week, history
of hypertension, hyperlipidemia, and smoking.

EXAM:
CHEST  2 VIEW

[view not recorded (1 of 2)]
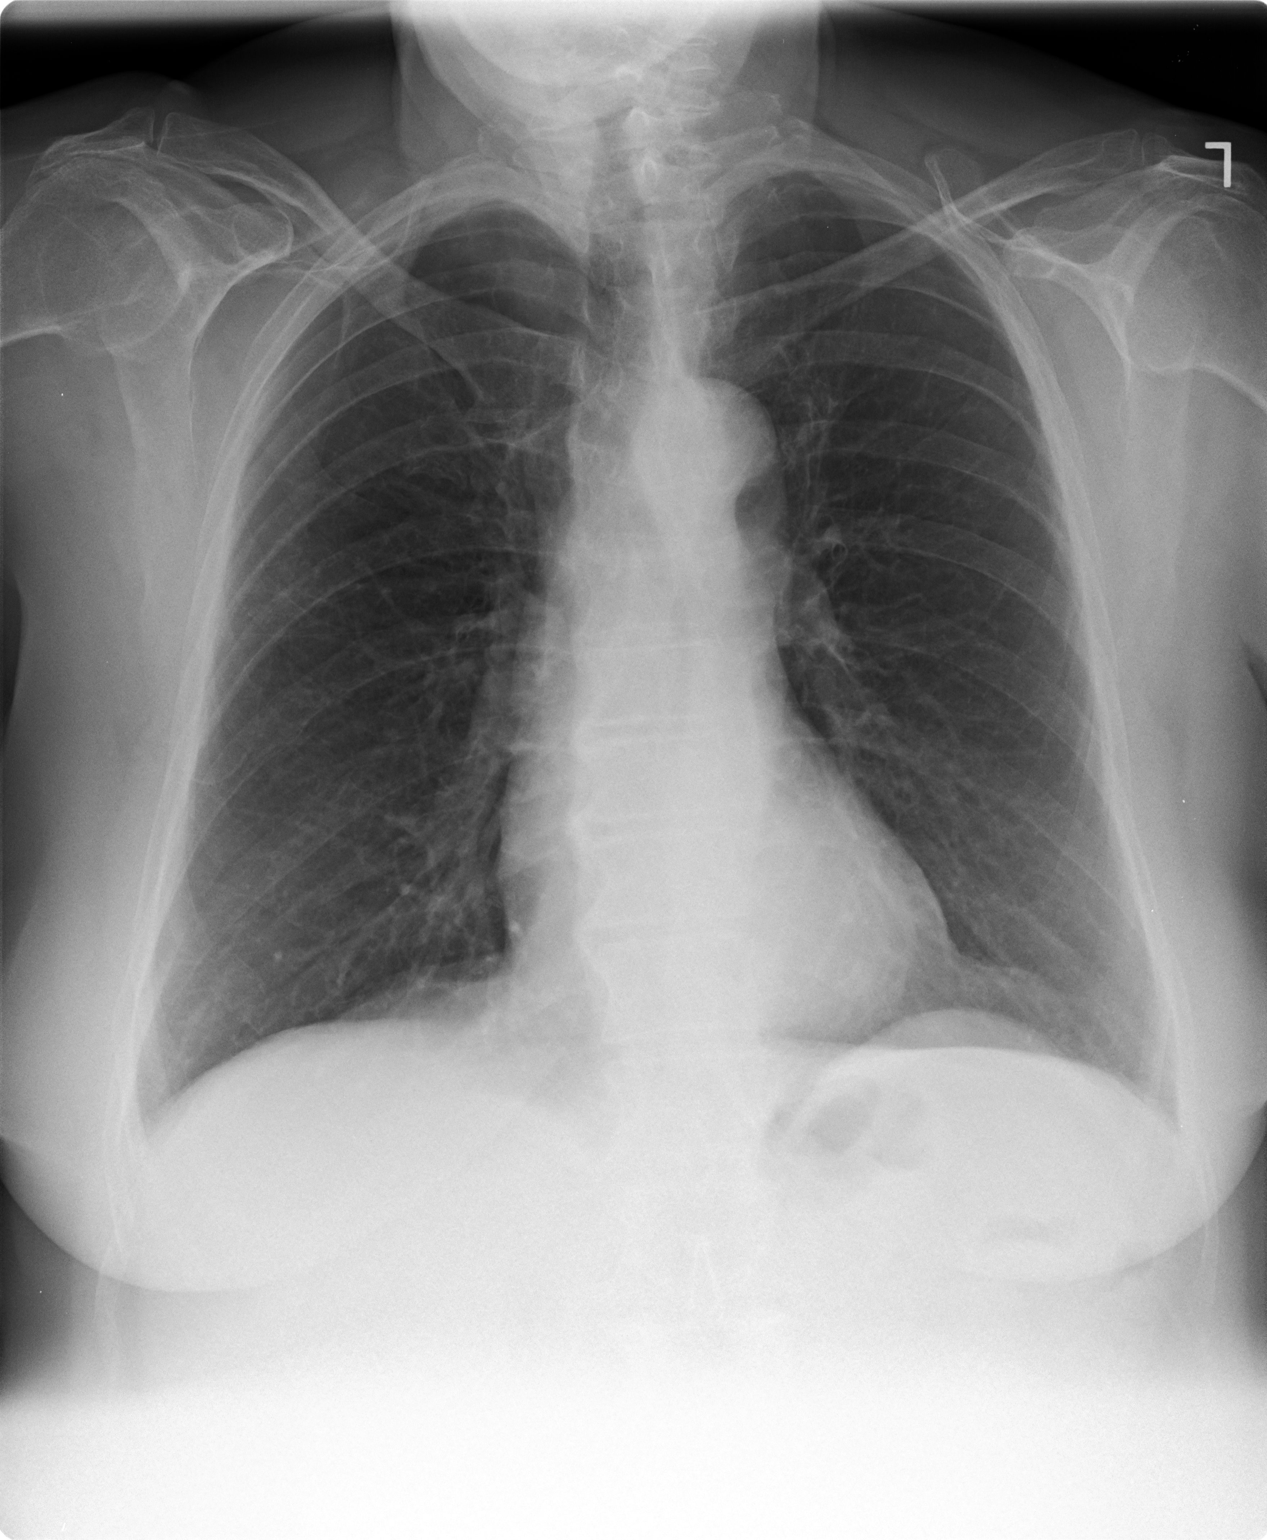

[view not recorded (2 of 2)]
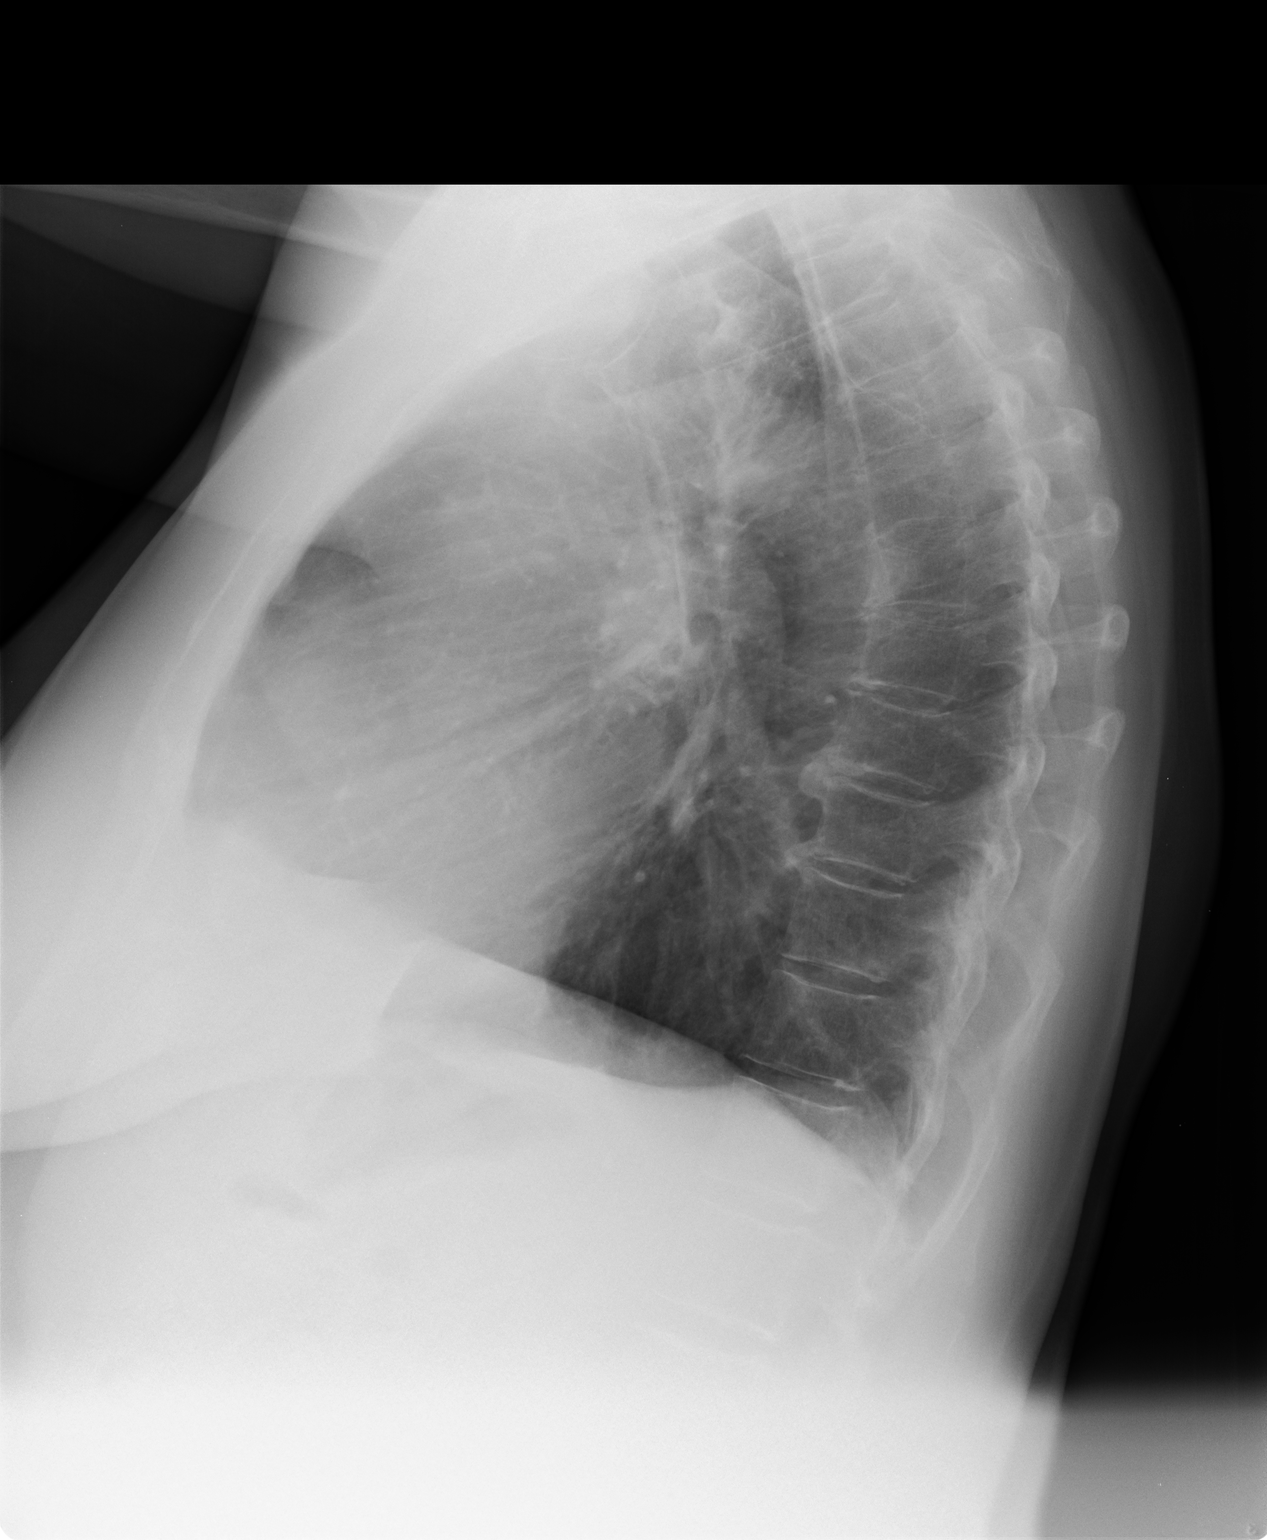

[2 of 2 positions shown; findings below may reference images not displayed]

FINDINGS: The lungs are mildly hyperinflated with hemidiaphragm flattening.
There is no focal infiltrate. There is no pleural effusion. The
heart and pulmonary vascularity are normal. The mediastinum is
normal in width. The bony thorax exhibits no acute abnormality.
There are degenerative changes of both shoulders.
IMPRESSION: Mild hyperinflation which suggests air trapping as might be seen
with COPD or reactive airway disease. There is no pneumonia, CHF,
nor other acute cardiopulmonary abnormality.

## 2016-06-12 IMAGING — CR DG WRIST COMPLETE 3+V*R*
4 series · 4 of 4 positions shown · non-contrast
Comparison: Right hand series of July 10, 2015

CLINICAL DATA: Right wrist pain and swelling for the past week
without known injury

EXAM:
RIGHT WRIST - COMPLETE 3+ VIEW

[view not recorded (1 of 4)]
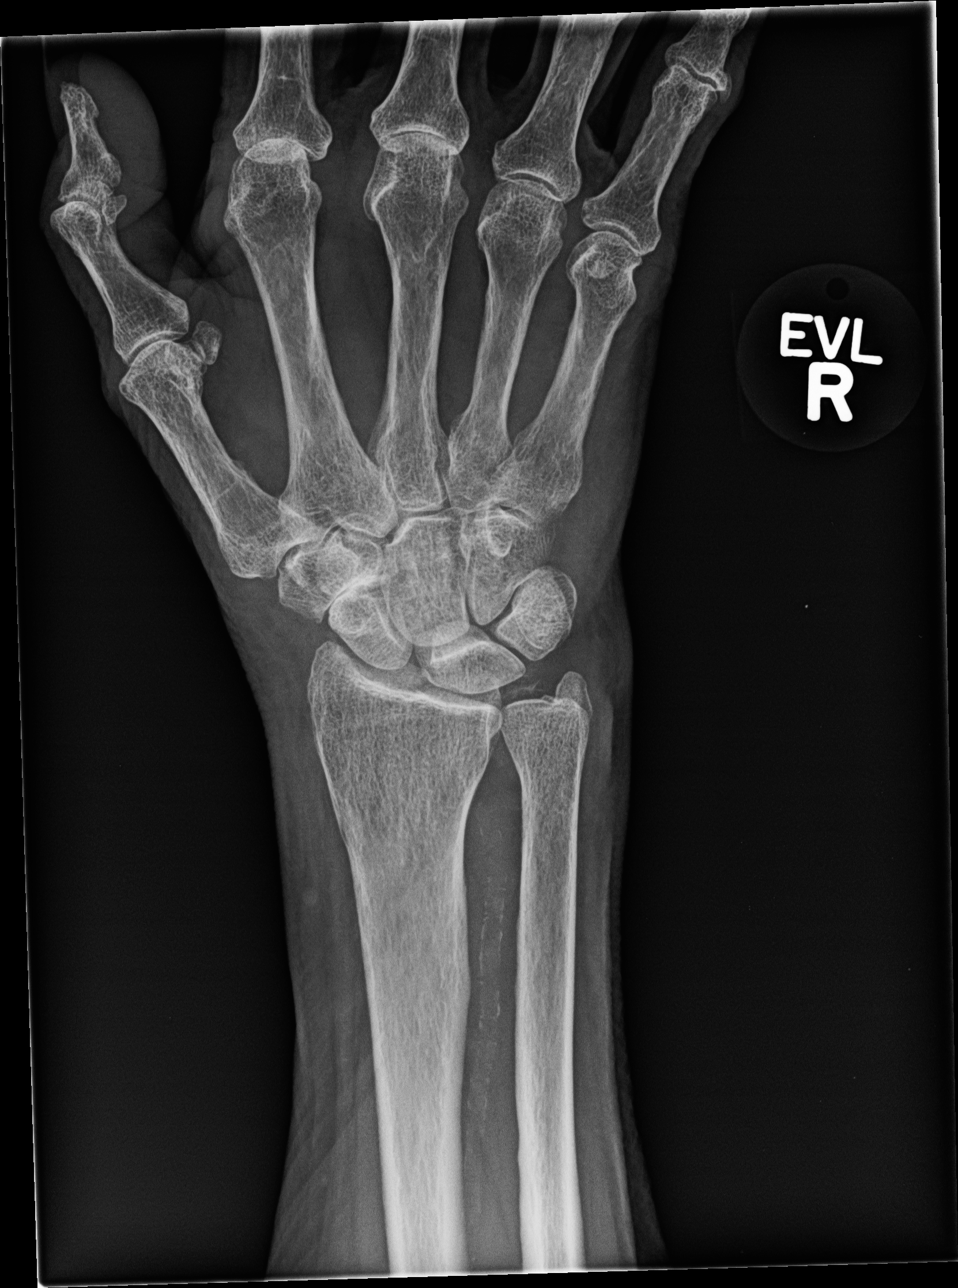

[view not recorded (2 of 4)]
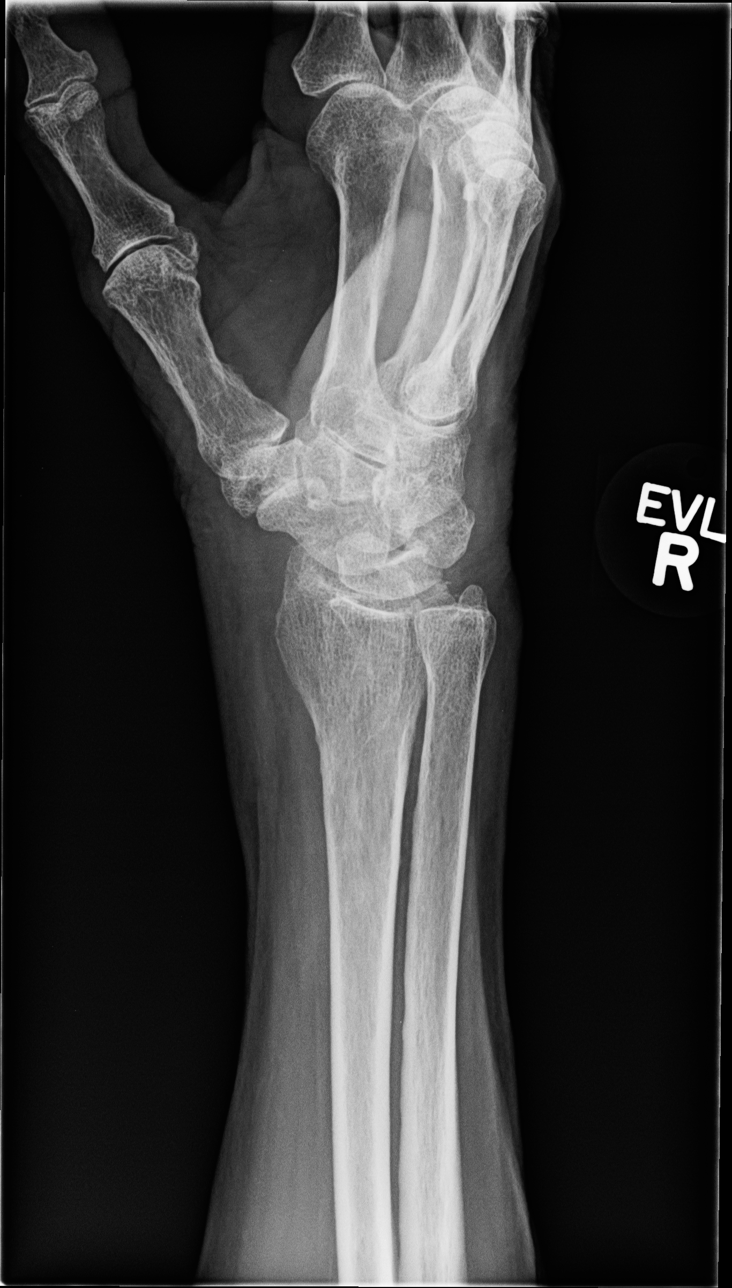

[view not recorded (3 of 4)]
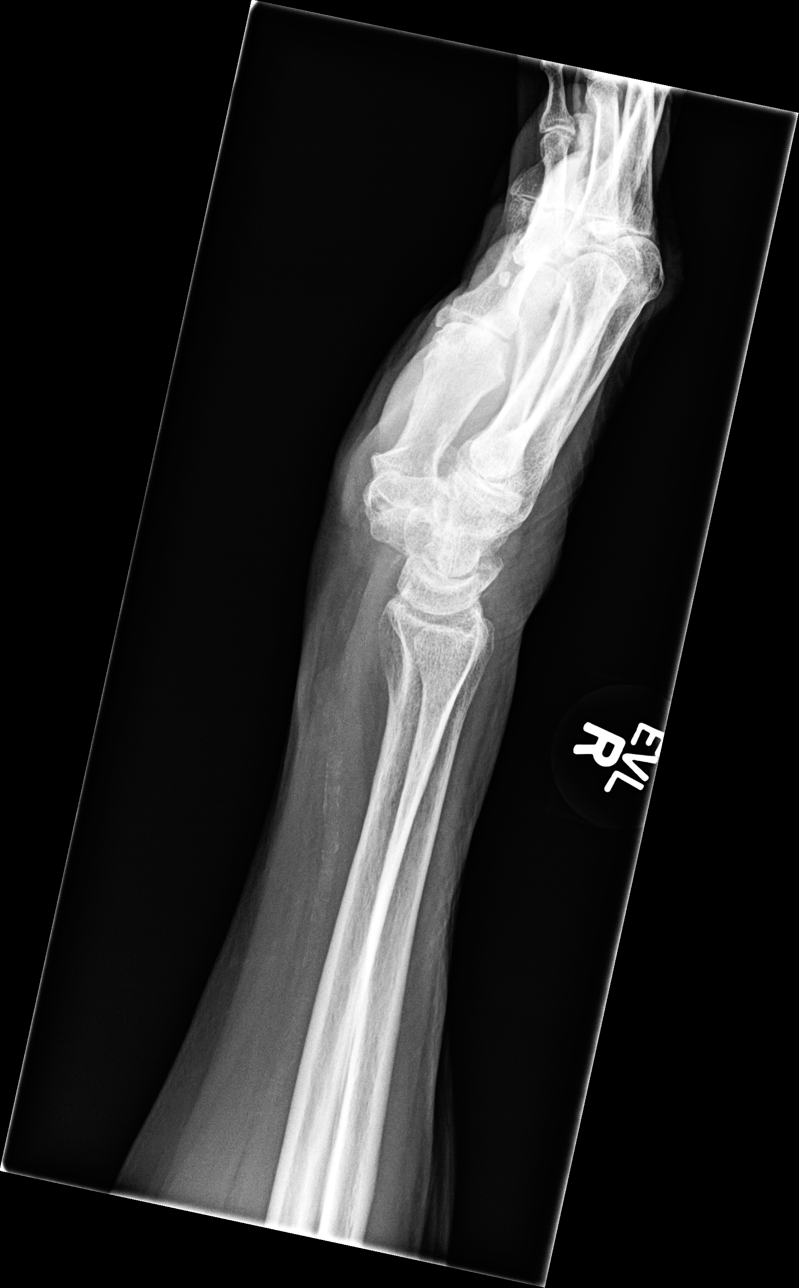

[view not recorded (4 of 4)]
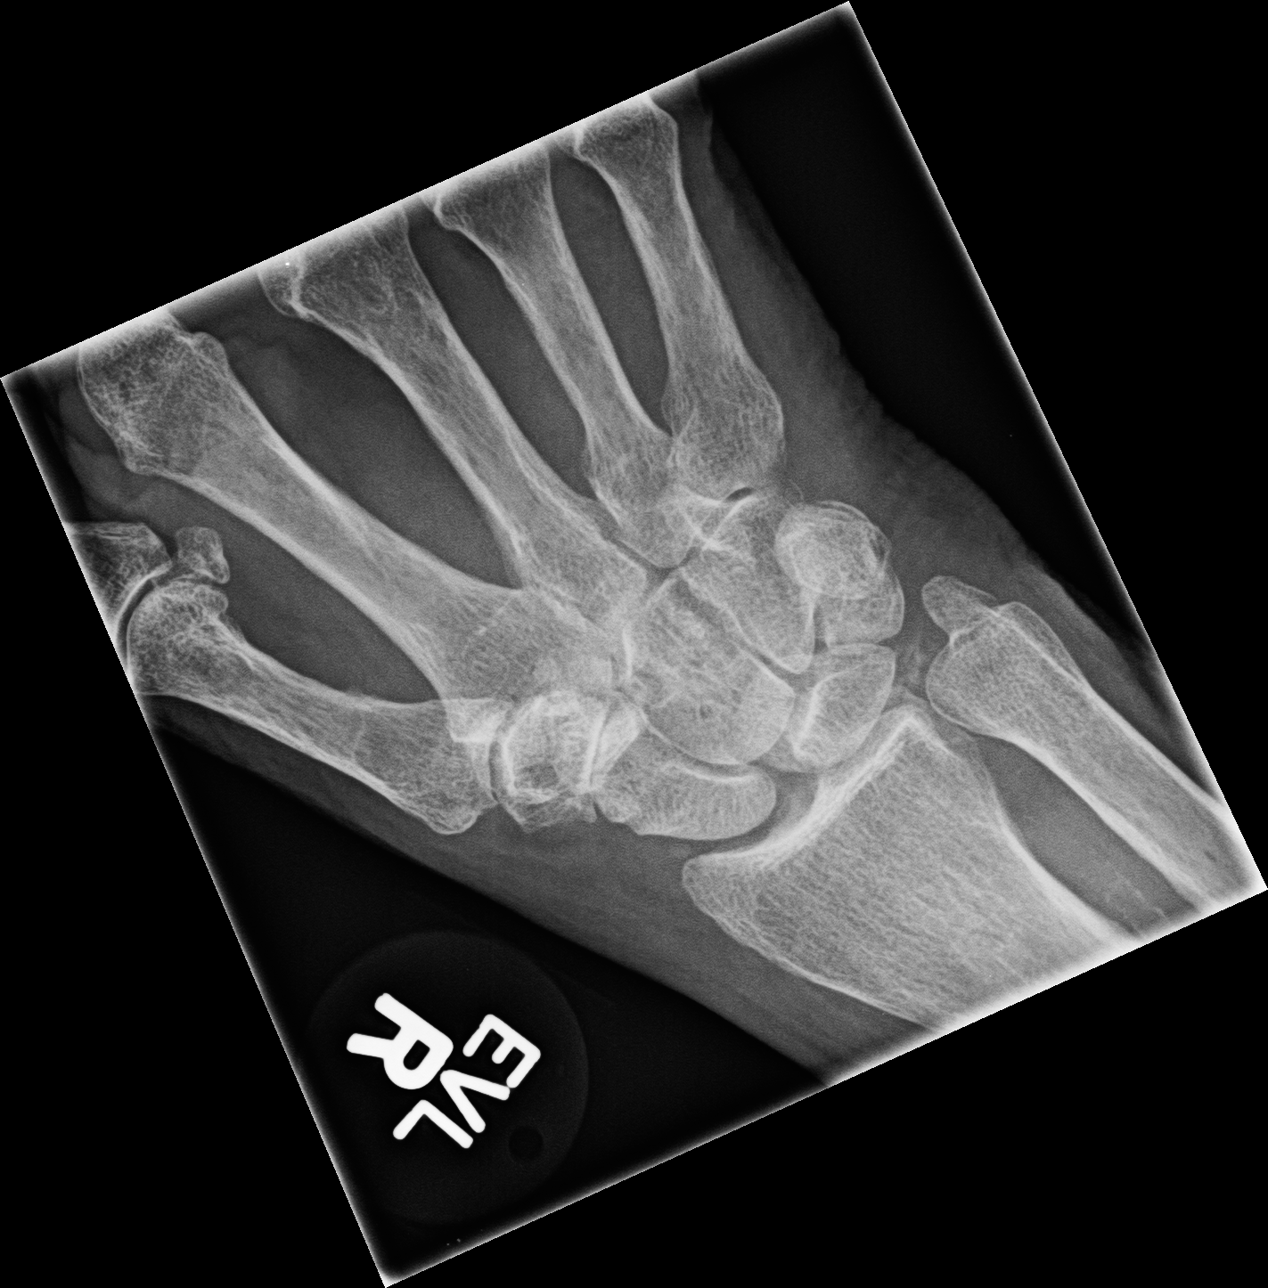

[4 of 4 positions shown; findings below may reference images not displayed]

FINDINGS: The bones are osteopenic. No juxta-articular osteopenic pattern is
observed. There is calcification of the triangular fibrocartilage.
The distal radius and ulna appear intact. There is narrowing of the
joint space between the distal pole of the scaphoid and the
trapezium and trapezoid. The other intercarpal joint spaces are
preserved. There is mild degenerative change of the first
carpometacarpal joint. There is noted be joint space loss of the
second and third MCP joints. No erosive changes are observed. The
soft tissues appear mildly swollen over the wrist. There is arterial
calcification in the distal forearm.
IMPRESSION: There are mild osteoarthritic changes of the wrist centered on the
triangular fiber cartilage and on the articulation of the distal
pole of the scaphoid with the trapezium and trapezoid. There is mild
diffuse soft tissue swelling.

## 2016-06-18 ENCOUNTER — Other Ambulatory Visit: Payer: Self-pay | Admitting: Nurse Practitioner

## 2016-06-18 DIAGNOSIS — E119 Type 2 diabetes mellitus without complications: Secondary | ICD-10-CM

## 2016-08-07 ENCOUNTER — Other Ambulatory Visit: Payer: Self-pay | Admitting: Nurse Practitioner

## 2016-08-07 DIAGNOSIS — I10 Essential (primary) hypertension: Secondary | ICD-10-CM

## 2016-08-07 NOTE — Telephone Encounter (Signed)
Last refill without being seen 

## 2016-10-22 ENCOUNTER — Ambulatory Visit: Payer: Commercial Managed Care - HMO | Admitting: Nurse Practitioner

## 2016-10-24 ENCOUNTER — Other Ambulatory Visit: Payer: Self-pay | Admitting: Nurse Practitioner

## 2016-10-24 DIAGNOSIS — E119 Type 2 diabetes mellitus without complications: Secondary | ICD-10-CM

## 2016-10-25 ENCOUNTER — Encounter: Payer: Self-pay | Admitting: Nurse Practitioner

## 2016-10-25 ENCOUNTER — Ambulatory Visit (INDEPENDENT_AMBULATORY_CARE_PROVIDER_SITE_OTHER): Payer: Medicare HMO | Admitting: Nurse Practitioner

## 2016-10-25 VITALS — BP 129/78 | HR 90 | Temp 96.9°F | Ht 64.0 in | Wt 158.0 lb

## 2016-10-25 DIAGNOSIS — K219 Gastro-esophageal reflux disease without esophagitis: Secondary | ICD-10-CM | POA: Diagnosis not present

## 2016-10-25 DIAGNOSIS — I10 Essential (primary) hypertension: Secondary | ICD-10-CM

## 2016-10-25 DIAGNOSIS — E119 Type 2 diabetes mellitus without complications: Secondary | ICD-10-CM | POA: Diagnosis not present

## 2016-10-25 DIAGNOSIS — H43392 Other vitreous opacities, left eye: Secondary | ICD-10-CM

## 2016-10-25 LAB — BAYER DCA HB A1C WAIVED: HB A1C (BAYER DCA - WAIVED): 7.8 % — ABNORMAL HIGH (ref <7.0)

## 2016-10-25 MED ORDER — CLONAZEPAM 0.5 MG PO TABS: 0.5000 mg | ORAL_TABLET | Freq: Two times a day (BID) | ORAL | 1 refills | Status: DC | PRN

## 2016-10-25 MED ORDER — ESOMEPRAZOLE MAGNESIUM 20 MG PO CPDR
20.0000 mg | DELAYED_RELEASE_CAPSULE | Freq: Every day | ORAL | 1 refills | Status: DC
Start: 2016-10-25 — End: 2017-04-08

## 2016-10-25 MED ORDER — METFORMIN HCL 1000 MG PO TABS: 1000.0000 mg | ORAL_TABLET | Freq: Two times a day (BID) | ORAL | 3 refills | Status: DC

## 2016-10-25 MED ORDER — LISINOPRIL-HYDROCHLOROTHIAZIDE 10-12.5 MG PO TABS: 1.0000 | ORAL_TABLET | Freq: Every day | ORAL | 0 refills | Status: DC

## 2016-10-25 NOTE — Addendum Note (Signed)
Addended by: Bennie PieriniMARTIN, MARY-MARGARET on: 10/25/2016 04:59 PM   Modules accepted: Orders

## 2016-10-25 NOTE — Progress Notes (Signed)
Subjective:    Patient ID: Alyssa Ashley, female    DOB: 1941/05/06, 76 y.o.   MRN: [id number]  HPI     Review of Systems     Objective:   Physical Exam         Assessment & Plan:     Subjective:    Patient ID: Alyssa Ashley, female    DOB: June 22, 1941, 76 y.o.   MRN: [id number]  Patient here today for follow up of chronic medical problems. Pt has c/o decreased vision in her left eye and is in need of an opthamologist. Since her vision loss she has become anxious an unable to sleep at night.    Outpatient Encounter Prescriptions as of 10/04/2015  Medication Sig  . esomeprazole (NEXIUM) 20 MG capsule Take 1 capsule (20 mg total) by mouth daily at 12 noon.  Marland Kitchen lisinopril-hydrochlorothiazide (PRINZIDE,ZESTORETIC) 10-12.5 MG tablet Take 1 tablet by mouth daily.  . metFORMIN (GLUCOPHAGE) 500 MG tablet TAKE TWO TABLETS BY MOUTH TWICE DAILY WITH MEALS    Hypertension This is a chronic problem. The current episode started more than 1 year ago. The problem is unchanged. The problem is controlled. Pertinent negatives include no chest pain, headaches, palpitations or shortness of breath. Risk factors for coronary artery disease include diabetes mellitus and post-menopausal state. Past treatments include ACE inhibitors and diuretics. The current treatment provides significant improvement. Compliance problems include diet and exercise.  Hypertensive end-organ damage includes CAD/MI.  Diabetes She presents for her follow-up diabetic visit. She has type 2 diabetes mellitus. No MedicAlert identification noted. There are no hypoglycemic associated symptoms. Pertinent negatives for hypoglycemia include no headaches. Associated symptoms include foot paresthesias. Pertinent negatives for diabetes include no chest pain, no polydipsia, no polyphagia and no polyuria. There are no hypoglycemic complications. There are no diabetic complications. Risk factors for coronary artery disease include diabetes  mellitus, hypertension and post-menopausal. Current diabetic treatment includes oral agent (monotherapy). She is compliant with treatment most of the time. Her weight is stable. She is following a diabetic diet. When asked about meal planning, she reported none. She has not had a previous visit with a dietitian. She rarely participates in exercise. Her breakfast blood glucose is taken between 8-9 am. Her breakfast blood glucose range is generally 110-130 mg/dl. Her overall blood glucose range is 110-130 mg/dl. An ACE inhibitor/angiotensin II receptor blocker is being taken. She does not see a podiatrist.Eye exam is not current.  GERD Patient taking nexium and that is working well for her.   Review of Systems  Constitutional: Negative.   HENT: Decreased vision in left eye, reports to be seeing "yellow bees".  Respiratory: Negative for shortness of breath.   Cardiovascular: Negative for chest pain and palpitations.  Gastrointestinal: Negative.   Endocrine: Negative for polydipsia, polyphagia and polyuria.  Genitourinary: Negative.   Neurological: Negative for facial asymmetry and headaches.  Psychiatric/Behavioral: Negative.    BP 129/78   Pulse 90   Temp (!) 96.9 F (36.1 C) (Oral)   Ht 5\' 4"  (1.626 m)   Wt 158 lb (71.7 kg)   BMI 27.12 kg/m      Objective:   Physical Exam  Constitutional: She is oriented to person, place, and time. She appears well-developed and well-nourished.  HENT:  Nose: Nose normal.  Mouth/Throat: Oropharynx is clear and moist.  Eyes: Decreased vision in left eye. Floaters in sight of vision.  Neck: Trachea normal, normal range of motion and full passive range of motion without  pain. Neck supple. No JVD present. Carotid bruit is not present. No thyromegaly present.  Cardiovascular: Normal rate, regular rhythm, normal heart sounds and intact distal pulses.  Exam reveals no gallop and no friction rub.   No murmur heard. Pulmonary/Chest: Effort normal and breath  sounds normal.  Abdominal: Soft. Bowel sounds are normal. She exhibits no distension and no mass. There is no tenderness.  Musculoskeletal: Normal range of motion.  Lymphadenopathy:    She has no cervical adenopathy.  Neurological: She is alert and oriented to person, place, and time. She has normal reflexes.  Skin: Skin is warm and dry.  Psychiatric: She has a normal mood and affect. Her behavior is normal. Judgment and thought content normal.         Assessment & Plan:  1. Type 2 diabetes mellitus without complication, without long-term current use of insulin (HCC) Metformin increased to 1000 bid.  - Bayer DCA Hb A1c Waived - Lipid panel - Microalbumin / creatinine urine ratio  2. Essential hypertension Continue taking prinzide, DASH diet - CMP14+EGFR  3. Gastroesophageal reflux disease without esophagitis Continue taking nexium. Avoid spicy foods Do not eat 2 hours prior to bedtime.  4. Floaters in visual field, left URGENT referral to opthalmology provided.   HBA1c 7.8 %  Labs pending Health maintenance reviewed Diet and exercise encouraged Continue all meds Follow up  In 3 months   Elder Love, FNP student Mary-Margaret Daphine Deutscher, FNP

## 2016-10-25 NOTE — Patient Instructions (Signed)
Eye Floaters Eye floaters are specks of material that float around inside your eye. A jelly-like fluid (vitreous) fills the inside of your eye. The vitreous is normally clear. It allows light to pass through to tissues at the back of the eye (retina). The retina contains the nerves needed for vision. Your vitreous can start to shrink and become stringy as you age. Strands of material may start to float around inside the eye. They come from clumps of cells, blood, or other materials. These objects cast shadows on the retina and show up as floaters. Floaters may be more obvious when you look up at the sky or at a bright, blank background. They do not go away completely. In time, however, they may settle below your line of sight. Floaters can be annoying. They do not usually cause vision problems. Sometimes floaters appear along with flashes. Flashes look like bright, quick streaks of light. They usually occur at the edge of your vision. Flashes result when your vitreous pulls on your retina. They also occur with age. However, they could be a warning sign of a detached retina. This is a serious condition that requires emergency treatment to prevent vision loss. What are the causes? For most people, eye floaters develop when the vitreous begins to shrink as a normal part of aging. More serious causes of floaters include:  A torn retina.  Injury.  Bleeding inside the eye. Diabetes and other conditions can cause broken retinal blood vessels.  A blood clot in the major vein of the retina or its branches (retinal vein occlusion).  Retinal detachment.  Vitreous detachment.  Inflammation inside the eye (uveitis).  Infection inside the eye. What increases the risk? You may have a higher risk for floaters if:  You are older.  You are nearsighted.  You have diabetes.  You have had cataracts removed. What are the signs or symptoms? Symptoms of floaters include seeing small, shadowy shapes move  across your vision. They move as your eyes move. They drift out of your vision when you keep your eyes still. These shapes may look like:  Specks.  Dots.  Circles.  Squiggly lines.  Thread. Symptoms of flashes include seeing:  Bursts of light.  Flashing lights.  Lightning streaks.  What is commonly referred to as "stars." How is this diagnosed? Your health care provider may diagnose floaters and flashes based on your symptoms. You may need to see an eye care specialist (optometrist or ophthalmologist). The specialist will do an exam to determine whether your floaters are a normal part of aging or a warning sign of a more serious eye problem. The specialist may put drops in your eyes to open your pupils wide (dilate) and then use a special scope (slit lamp) to look inside your eye. How is this treated? No treatment is needed for floaters that occur normally with age. Sometimes floaters become severe enough to affect your vision. In rare cases, surgery to remove the vitreous and replace it with a saltwater solution (vitrectomy) may be considered. Follow these instructions at home: Keep all follow-up visits as directed by your health care provider. This is important. Contact a health care provider if:  You have a sudden increase in floaters.  You have floaters along with flashes.  You have floaters along with any new eye symptoms. Get help right away if:  You have a sudden increase in floaters or flashes that interferes with your vision.  Your vision suddenly changes. This information is not intended  to replace advice given to you by your health care provider. Make sure you discuss any questions you have with your health care provider. Document Released: 08/02/2003 Document Revised: 01/05/2016 Document Reviewed: 03/24/2014 Elsevier Interactive Patient Education  2017 [employer]

## 2016-10-26 DIAGNOSIS — H34812 Central retinal vein occlusion, left eye, with macular edema: Secondary | ICD-10-CM | POA: Diagnosis not present

## 2016-10-26 DIAGNOSIS — H35373 Puckering of macula, bilateral: Secondary | ICD-10-CM | POA: Diagnosis not present

## 2016-10-26 LAB — CMP14+EGFR
A/G RATIO: 1.6 (ref 1.2–2.2)
ALK PHOS: 73 IU/L (ref 39–117)
ALT: 10 IU/L (ref 0–32)
AST: 11 IU/L (ref 0–40)
Albumin: 3.9 g/dL (ref 3.5–4.8)
BUN/Creatinine Ratio: 24 (ref 12–28)
BUN: 19 mg/dL (ref 8–27)
Bilirubin Total: <0.2 mg/dL (ref 0.0–1.2)
CALCIUM: 9.2 mg/dL (ref 8.7–10.3)
CHLORIDE: 98 mmol/L (ref 96–106)
CO2: 26 mmol/L (ref 18–29)
Creatinine, Ser: 0.8 mg/dL (ref 0.57–1.00)
GFR calc Af Amer: 83 mL/min/{1.73_m2} (ref >59)
GFR calc non Af Amer: 72 mL/min/{1.73_m2} (ref >59)
GLOBULIN, TOTAL: 2.4 g/dL (ref 1.5–4.5)
Glucose: 196 mg/dL — ABNORMAL HIGH (ref 65–99)
Potassium: 4.1 mmol/L (ref 3.5–5.2)
SODIUM: 141 mmol/L (ref 134–144)
Total Protein: 6.3 g/dL (ref 6.0–8.5)

## 2016-10-26 LAB — LIPID PANEL
Chol/HDL Ratio: 3.2 ratio units (ref 0.0–4.4)
Cholesterol, Total: 163 mg/dL (ref 100–199)
HDL: 51 mg/dL (ref >39)
LDL Calculated: 73 mg/dL (ref 0–99)
TRIGLYCERIDES: 194 mg/dL — AB (ref 0–149)
VLDL Cholesterol Cal: 39 mg/dL (ref 5–40)

## 2016-10-26 LAB — MICROALBUMIN / CREATININE URINE RATIO
Creatinine, Urine: 120.8 mg/dL
MICROALB/CREAT RATIO: 12.1 mg/g{creat} (ref 0.0–30.0)
Microalbumin, Urine: 14.6 ug/mL

## 2016-11-02 DIAGNOSIS — H34812 Central retinal vein occlusion, left eye, with macular edema: Secondary | ICD-10-CM | POA: Diagnosis not present

## 2016-11-29 ENCOUNTER — Encounter: Payer: Self-pay | Admitting: Family Medicine

## 2016-11-29 ENCOUNTER — Ambulatory Visit (INDEPENDENT_AMBULATORY_CARE_PROVIDER_SITE_OTHER): Payer: Medicare HMO | Admitting: Family Medicine

## 2016-11-29 VITALS — BP 136/87 | HR 94 | Temp 96.7°F | Ht 64.0 in | Wt 154.4 lb

## 2016-11-29 DIAGNOSIS — M722 Plantar fascial fibromatosis: Secondary | ICD-10-CM | POA: Diagnosis not present

## 2016-11-29 MED ORDER — NAPROXEN 375 MG PO TABS: 375.0000 mg | ORAL_TABLET | Freq: Two times a day (BID) | ORAL | 0 refills | Status: DC

## 2016-11-29 NOTE — Progress Notes (Signed)
   HPI  Patient presents today here with right heel pain.  Patient's when she has pain at the distal base of her right calcaneal area. She has no injury. Pain is worse in the morning, Nexium difficult to walk. She states that she's walking on her toes most of time.  He has not tried ice. She's had similar pain once before but less severe and short-lived.  PMH: Smoking status noted ROS: Per HPI  Objective: BP 136/87   Pulse 94   Temp (!) 96.7 F (35.9 C) (Oral)   Ht  (1.626 m)   Wt 154 lb 6.4 oz (70 kg)   BMI 26.50 kg/m  Gen: NAD, alert, cooperative with exam HEENT: NCAT CV: RRR, good S1/S2, no murmur Resp: CTABL, no wheezes, non-labored Ext: No edema, warm Neuro: Alert and oriented, No gross deficits  Msk:  No erythema or swelling of the foot, no gross deformity Tenderness to palpation at the insertion of the plantar fascia on the right.   Assessment and plan:  # Plantar fascitis Classic presentation, recommended scheduled NSAIDs 7 days, ice, and stretches. Using NSAIDs very cautiously in this 76 year old, however renal function is normal. Return to clinic if needed, discussed possibly [place], however should not change management much.  Handout form sports medicine patient advisor given.    Meds ordered this encounter  Medications  . naproxen (NAPROSYN) 375 MG tablet    Sig: Take 1 tablet (375 mg total) by mouth 2 (two) times daily with a meal.    Dispense:  14 tablet    Refill:  0    Murtis Sink, MD [place] [facility] 11/29/2016, 8:58 AM

## 2016-11-29 NOTE — Patient Instructions (Signed)
Great to see you!  Try ice 15-20 minutes 4 times daily  Start naprosyn 2 times daily for 1 week  Try the stretches in the handout I gave you.

## 2016-12-31 DIAGNOSIS — M542 Cervicalgia: Secondary | ICD-10-CM | POA: Diagnosis not present

## 2016-12-31 DIAGNOSIS — M436 Torticollis: Secondary | ICD-10-CM | POA: Diagnosis not present

## 2017-01-29 ENCOUNTER — Other Ambulatory Visit: Payer: Self-pay | Admitting: Nurse Practitioner

## 2017-01-29 DIAGNOSIS — I10 Essential (primary) hypertension: Secondary | ICD-10-CM

## 2017-02-19 DIAGNOSIS — Z8673 Personal history of transient ischemic attack (TIA), and cerebral infarction without residual deficits: Secondary | ICD-10-CM | POA: Diagnosis not present

## 2017-02-19 DIAGNOSIS — M1990 Unspecified osteoarthritis, unspecified site: Secondary | ICD-10-CM | POA: Diagnosis not present

## 2017-02-19 DIAGNOSIS — Z7984 Long term (current) use of oral hypoglycemic drugs: Secondary | ICD-10-CM | POA: Diagnosis not present

## 2017-02-19 DIAGNOSIS — R42 Dizziness and giddiness: Secondary | ICD-10-CM | POA: Diagnosis not present

## 2017-02-19 DIAGNOSIS — Z79899 Other long term (current) drug therapy: Secondary | ICD-10-CM | POA: Diagnosis not present

## 2017-02-19 DIAGNOSIS — I10 Essential (primary) hypertension: Secondary | ICD-10-CM | POA: Diagnosis not present

## 2017-02-19 DIAGNOSIS — E119 Type 2 diabetes mellitus without complications: Secondary | ICD-10-CM | POA: Diagnosis not present

## 2017-02-19 DIAGNOSIS — H8110 Benign paroxysmal vertigo, unspecified ear: Secondary | ICD-10-CM | POA: Diagnosis not present

## 2017-04-03 ENCOUNTER — Other Ambulatory Visit: Payer: Self-pay | Admitting: Nurse Practitioner

## 2017-04-03 DIAGNOSIS — I10 Essential (primary) hypertension: Secondary | ICD-10-CM

## 2017-04-08 ENCOUNTER — Encounter: Payer: Self-pay | Admitting: Nurse Practitioner

## 2017-04-08 ENCOUNTER — Ambulatory Visit (INDEPENDENT_AMBULATORY_CARE_PROVIDER_SITE_OTHER): Payer: Medicare HMO | Admitting: Nurse Practitioner

## 2017-04-08 VITALS — BP 139/89 | HR 88 | Temp 96.8°F | Ht 64.0 in | Wt 157.0 lb

## 2017-04-08 DIAGNOSIS — I10 Essential (primary) hypertension: Secondary | ICD-10-CM

## 2017-04-08 DIAGNOSIS — F411 Generalized anxiety disorder: Secondary | ICD-10-CM | POA: Diagnosis not present

## 2017-04-08 DIAGNOSIS — E119 Type 2 diabetes mellitus without complications: Secondary | ICD-10-CM | POA: Diagnosis not present

## 2017-04-08 DIAGNOSIS — Z6826 Body mass index (BMI) 26.0-26.9, adult: Secondary | ICD-10-CM

## 2017-04-08 DIAGNOSIS — K219 Gastro-esophageal reflux disease without esophagitis: Secondary | ICD-10-CM

## 2017-04-08 LAB — BAYER DCA HB A1C WAIVED: HB A1C: 7.7 % — AB (ref <7.0)

## 2017-04-08 MED ORDER — ESOMEPRAZOLE MAGNESIUM 20 MG PO CPDR: 20.0000 mg | DELAYED_RELEASE_CAPSULE | Freq: Every day | ORAL | 1 refills | Status: AC

## 2017-04-08 MED ORDER — LISINOPRIL-HYDROCHLOROTHIAZIDE 10-12.5 MG PO TABS: 1.0000 | ORAL_TABLET | Freq: Every day | ORAL | 1 refills | Status: AC

## 2017-04-08 MED ORDER — METFORMIN HCL 1000 MG PO TABS: 1000.0000 mg | ORAL_TABLET | Freq: Two times a day (BID) | ORAL | 3 refills | Status: AC

## 2017-04-08 MED ORDER — CLONAZEPAM 0.5 MG PO TABS: 0.5000 mg | ORAL_TABLET | Freq: Two times a day (BID) | ORAL | 1 refills | Status: AC | PRN

## 2017-04-08 NOTE — Progress Notes (Signed)
Subjective:    Patient ID: Alyssa Ashley, female    DOB: Jan 04, 1941, 76 y.o.   MRN: [id number]  HPI  Alyssa Ashley is here today for follow up of chronic medical problem.  Outpatient Encounter Prescriptions as of 04/08/2017  Medication Sig  . clonazePAM (KLONOPIN) 0.5 MG tablet Take 1 tablet (0.5 mg total) by mouth 2 (two) times daily as needed for anxiety.  Marland Kitchen esomeprazole (NEXIUM) 20 MG capsule Take 1 capsule (20 mg total) by mouth daily at 12 noon.  Marland Kitchen lisinopril-hydrochlorothiazide (PRINZIDE,ZESTORETIC) 10-12.5 MG tablet TAKE 1 TABLET BY MOUTH ONCE DAILY  . metFORMIN (GLUCOPHAGE) 1000 MG tablet Take 1 tablet (1,000 mg total) by mouth 2 (two) times daily with a meal.  . naproxen (NAPROSYN) 375 MG tablet Take 1 tablet (375 mg total) by mouth 2 (two) times daily with a meal.   No facility-administered encounter medications on file as of 04/08/2017.     1. Essential hypertension  No c/o chest pain, SOB or headache. Does not check blood pressures at home  2. Gastroesophageal reflux disease without esophagitis  Takes nexium daily which seems to get symptoms under ocntrol  3. Type 2 diabetes mellitus without complication, without long-term current use of insulin (HCC) last HGBA1C was 7.8%. Blood sugars ae around 160 fasting, when she checks it.  4. BMI 26.0-26.9,adult  No recent weight changes.  5.      GAD           Takes klonopin as needed for anxiety   New complaints: None today  Social history: No longer drives- has daughters bring her to doctor and take her to grocery store.    Review of Systems  Constitutional: Negative for activity change and appetite change.  HENT: Negative.   Eyes: Negative for pain.  Respiratory: Negative for shortness of breath.   Cardiovascular: Negative for chest pain, palpitations and leg swelling.  Gastrointestinal: Negative for abdominal pain.  Endocrine: Negative for polydipsia.  Genitourinary: Negative.   Skin: Negative for rash.  Neurological:  Negative for dizziness, weakness and headaches.  Hematological: Does not bruise/bleed easily.  Psychiatric/Behavioral: Negative.   All other systems reviewed and are negative.      Objective:   Physical Exam  Constitutional: She is oriented to person, place, and time. She appears well-developed and well-nourished.  HENT:  Nose: Nose normal.  Mouth/Throat: Oropharynx is clear and moist.  Eyes: EOM are normal.  Neck: Trachea normal, normal range of motion and full passive range of motion without pain. Neck supple. No JVD present. Carotid bruit is not present. No thyromegaly present.  Cardiovascular: Normal rate, regular rhythm, normal heart sounds and intact distal pulses.  Exam reveals no gallop and no friction rub.   No murmur heard. Pulmonary/Chest: Effort normal and breath sounds normal.  Abdominal: Soft. Bowel sounds are normal. She exhibits no distension and no mass. There is no tenderness.  Musculoskeletal: Normal range of motion.  Lymphadenopathy:    She has no cervical adenopathy.  Neurological: She is alert and oriented to person, place, and time. She has normal reflexes.  Skin: Skin is warm and dry.  Psychiatric: She has a normal mood and affect. Her behavior is normal. Judgment and thought content normal.    BP 139/89   Pulse 88   Temp (!) 96.8 F (36 C) (Oral)   Ht 5\' 4"  (1.626 m)   Wt 157 lb (71.2 kg)   BMI 26.95 kg/m  hgba1c 7.7%    Assessment &  Plan:  1. Essential hypertension Low sodium diet - CMP14+EGFR - Lipid panel - lisinopril-hydrochlorothiazide (PRINZIDE,ZESTORETIC) 10-12.5 MG tablet; Take 1 tablet by mouth daily.  Dispense: 90 tablet; Refill: 1  2. Gastroesophageal reflux disease without esophagitis Avoid spicy foods Do not eat 2 hours prior to bedtime - esomeprazole (NEXIUM) 20 MG capsule; Take 1 capsule (20 mg total) by mouth daily at 12 noon.  Dispense: 90 capsule; Refill: 1  3. Type 2 diabetes mellitus without complication, without long-term  current use of insulin (HCC) Carb counting encouraged - Bayer DCA Hb A1c Waived - metFORMIN (GLUCOPHAGE) 1000 MG tablet; Take 1 tablet (1,000 mg total) by mouth 2 (two) times daily with a meal.  Dispense: 180 tablet; Refill: 3  4. BMI 26.0-26.9,adult Discussed diet and exercise for person with BMI >25 Will recheck weight in 3-6 months  5. GAD (generalized anxiety disorder) Stress management - clonazePAM (KLONOPIN) 0.5 MG tablet; Take 1 tablet (0.5 mg total) by mouth 2 (two) times daily as needed for anxiety.  Dispense: 60 tablet; Refill: 1    Labs pending Health maintenance reviewed- refuses all health maintenance Diet and exercise encouraged Continue all meds Follow up  In 3 months   Mary-Margaret Daphine Deutscher, FNP

## 2017-04-08 NOTE — Patient Instructions (Signed)

## 2017-04-09 LAB — CMP14+EGFR
ALBUMIN: 4.3 g/dL (ref 3.5–4.8)
ALK PHOS: 75 IU/L (ref 39–117)
ALT: 11 IU/L (ref 0–32)
AST: 14 IU/L (ref 0–40)
Albumin/Globulin Ratio: 1.6 (ref 1.2–2.2)
BUN/Creatinine Ratio: 20 (ref 12–28)
BUN: 17 mg/dL (ref 8–27)
Bilirubin Total: 0.2 mg/dL (ref 0.0–1.2)
CO2: 26 mmol/L (ref 20–29)
CREATININE: 0.85 mg/dL (ref 0.57–1.00)
Calcium: 9.7 mg/dL (ref 8.7–10.3)
Chloride: 101 mmol/L (ref 96–106)
GFR calc Af Amer: 77 mL/min/{1.73_m2} (ref >59)
GFR calc non Af Amer: 67 mL/min/{1.73_m2} (ref >59)
GLUCOSE: 151 mg/dL — AB (ref 65–99)
Globulin, Total: 2.7 g/dL (ref 1.5–4.5)
Potassium: 5.1 mmol/L (ref 3.5–5.2)
Sodium: 143 mmol/L (ref 134–144)
Total Protein: 7 g/dL (ref 6.0–8.5)

## 2017-04-09 LAB — LIPID PANEL
CHOLESTEROL TOTAL: 203 mg/dL — AB (ref 100–199)
Chol/HDL Ratio: 3.9 ratio (ref 0.0–4.4)
HDL: 52 mg/dL (ref >39)
LDL CALC: 98 mg/dL (ref 0–99)
TRIGLYCERIDES: 266 mg/dL — AB (ref 0–149)
VLDL CHOLESTEROL CAL: 53 mg/dL — AB (ref 5–40)

## 2017-10-08 DIAGNOSIS — D519 Vitamin B12 deficiency anemia, unspecified: Secondary | ICD-10-CM | POA: Diagnosis not present

## 2017-10-08 DIAGNOSIS — I10 Essential (primary) hypertension: Secondary | ICD-10-CM | POA: Diagnosis not present

## 2017-10-08 DIAGNOSIS — F411 Generalized anxiety disorder: Secondary | ICD-10-CM | POA: Diagnosis not present

## 2017-10-08 DIAGNOSIS — E1165 Type 2 diabetes mellitus with hyperglycemia: Secondary | ICD-10-CM | POA: Diagnosis not present

## 2017-10-08 DIAGNOSIS — E782 Mixed hyperlipidemia: Secondary | ICD-10-CM | POA: Diagnosis not present

## 2017-10-08 DIAGNOSIS — E559 Vitamin D deficiency, unspecified: Secondary | ICD-10-CM | POA: Diagnosis not present

## 2017-10-08 DIAGNOSIS — E0865 Diabetes mellitus due to underlying condition with hyperglycemia: Secondary | ICD-10-CM | POA: Diagnosis not present

## 2018-01-09 DIAGNOSIS — E1165 Type 2 diabetes mellitus with hyperglycemia: Secondary | ICD-10-CM | POA: Diagnosis not present

## 2018-01-09 DIAGNOSIS — E782 Mixed hyperlipidemia: Secondary | ICD-10-CM | POA: Diagnosis not present

## 2018-01-09 DIAGNOSIS — F411 Generalized anxiety disorder: Secondary | ICD-10-CM | POA: Diagnosis not present

## 2018-01-09 DIAGNOSIS — H81393 Other peripheral vertigo, bilateral: Secondary | ICD-10-CM | POA: Diagnosis not present

## 2018-01-09 DIAGNOSIS — R5383 Other fatigue: Secondary | ICD-10-CM | POA: Diagnosis not present

## 2018-01-09 DIAGNOSIS — I10 Essential (primary) hypertension: Secondary | ICD-10-CM | POA: Diagnosis not present

## 2018-01-09 DIAGNOSIS — E119 Type 2 diabetes mellitus without complications: Secondary | ICD-10-CM | POA: Diagnosis not present

## 2018-01-09 DIAGNOSIS — Z79899 Other long term (current) drug therapy: Secondary | ICD-10-CM | POA: Diagnosis not present

## 2018-01-09 DIAGNOSIS — E559 Vitamin D deficiency, unspecified: Secondary | ICD-10-CM | POA: Diagnosis not present

## 2018-01-09 DIAGNOSIS — D513 Other dietary vitamin B12 deficiency anemia: Secondary | ICD-10-CM | POA: Diagnosis not present

## 2018-04-10 DIAGNOSIS — Z1389 Encounter for screening for other disorder: Secondary | ICD-10-CM | POA: Diagnosis not present

## 2018-04-10 DIAGNOSIS — Z0000 Encounter for general adult medical examination without abnormal findings: Secondary | ICD-10-CM | POA: Diagnosis not present

## 2018-04-10 DIAGNOSIS — F411 Generalized anxiety disorder: Secondary | ICD-10-CM | POA: Diagnosis not present

## 2018-04-10 DIAGNOSIS — H81393 Other peripheral vertigo, bilateral: Secondary | ICD-10-CM | POA: Diagnosis not present

## 2018-04-10 DIAGNOSIS — E1165 Type 2 diabetes mellitus with hyperglycemia: Secondary | ICD-10-CM | POA: Diagnosis not present

## 2018-04-10 DIAGNOSIS — E782 Mixed hyperlipidemia: Secondary | ICD-10-CM | POA: Diagnosis not present

## 2018-04-10 DIAGNOSIS — I10 Essential (primary) hypertension: Secondary | ICD-10-CM | POA: Diagnosis not present

## 2018-07-08 DIAGNOSIS — I10 Essential (primary) hypertension: Secondary | ICD-10-CM | POA: Diagnosis not present

## 2018-07-08 DIAGNOSIS — F411 Generalized anxiety disorder: Secondary | ICD-10-CM | POA: Diagnosis not present

## 2018-07-08 DIAGNOSIS — E1165 Type 2 diabetes mellitus with hyperglycemia: Secondary | ICD-10-CM | POA: Diagnosis not present

## 2018-07-08 DIAGNOSIS — I208 Other forms of angina pectoris: Secondary | ICD-10-CM | POA: Diagnosis not present

## 2018-07-08 DIAGNOSIS — E119 Type 2 diabetes mellitus without complications: Secondary | ICD-10-CM | POA: Diagnosis not present

## 2018-07-08 DIAGNOSIS — E782 Mixed hyperlipidemia: Secondary | ICD-10-CM | POA: Diagnosis not present

## 2018-07-24 ENCOUNTER — Encounter (HOSPITAL_COMMUNITY): Payer: Medicare HMO

## 2018-10-16 DIAGNOSIS — E782 Mixed hyperlipidemia: Secondary | ICD-10-CM | POA: Diagnosis not present

## 2018-10-16 DIAGNOSIS — E1165 Type 2 diabetes mellitus with hyperglycemia: Secondary | ICD-10-CM | POA: Diagnosis not present

## 2018-10-16 DIAGNOSIS — I10 Essential (primary) hypertension: Secondary | ICD-10-CM | POA: Diagnosis not present

## 2018-10-16 DIAGNOSIS — F411 Generalized anxiety disorder: Secondary | ICD-10-CM | POA: Diagnosis not present

## 2018-10-17 DIAGNOSIS — E782 Mixed hyperlipidemia: Secondary | ICD-10-CM | POA: Diagnosis not present

## 2018-10-17 DIAGNOSIS — I10 Essential (primary) hypertension: Secondary | ICD-10-CM | POA: Diagnosis not present

## 2018-10-17 DIAGNOSIS — Z79899 Other long term (current) drug therapy: Secondary | ICD-10-CM | POA: Diagnosis not present

## 2018-10-17 DIAGNOSIS — F411 Generalized anxiety disorder: Secondary | ICD-10-CM | POA: Diagnosis not present

## 2018-10-17 DIAGNOSIS — E559 Vitamin D deficiency, unspecified: Secondary | ICD-10-CM | POA: Diagnosis not present

## 2018-10-17 DIAGNOSIS — E1165 Type 2 diabetes mellitus with hyperglycemia: Secondary | ICD-10-CM | POA: Diagnosis not present

## 2019-01-28 DIAGNOSIS — F411 Generalized anxiety disorder: Secondary | ICD-10-CM | POA: Diagnosis not present

## 2019-01-28 DIAGNOSIS — Z6828 Body mass index (BMI) 28.0-28.9, adult: Secondary | ICD-10-CM | POA: Diagnosis not present

## 2019-01-28 DIAGNOSIS — I10 Essential (primary) hypertension: Secondary | ICD-10-CM | POA: Diagnosis not present

## 2019-01-28 DIAGNOSIS — E1165 Type 2 diabetes mellitus with hyperglycemia: Secondary | ICD-10-CM | POA: Diagnosis not present

## 2019-01-28 DIAGNOSIS — E782 Mixed hyperlipidemia: Secondary | ICD-10-CM | POA: Diagnosis not present

## 2019-01-28 DIAGNOSIS — Z0000 Encounter for general adult medical examination without abnormal findings: Secondary | ICD-10-CM | POA: Diagnosis not present

## 2019-01-28 DIAGNOSIS — E119 Type 2 diabetes mellitus without complications: Secondary | ICD-10-CM | POA: Diagnosis not present

## 2019-02-26 ENCOUNTER — Telehealth: Payer: Self-pay | Admitting: Nurse Practitioner

## 2019-02-26 NOTE — Chronic Care Management (AMB) (Signed)
Chronic Care Management   Outreach Note  02/26/2019 Name: Alyssa Ashley MRN: [id number] DOB: 1941/01/31  Referred by: Bennie Pierini, FNP Reason for referral : Chronic Care Management (Third CCM [name] was unsuccessful )   Third unsuccessful telephone outreach was attempted today. The patient was referred to the case management team for assistance with chronic care management and care coordination. The patient's primary care provider has been notified of our unsuccessful attempts to make or maintain contact with the patient. The care management team is pleased to engage with this patient at any time in the future should he/she be interested in assistance from the care management team.   Follow Up Plan: The care management team is available to follow up with the patient after provider conversation with the patient regarding recommendation for care management engagement and subsequent re-referral to the care management team.   [facility] Guide  [facility]   Connected Care  [email or web address]   ??[phone]

## 2019-05-05 DIAGNOSIS — F411 Generalized anxiety disorder: Secondary | ICD-10-CM | POA: Diagnosis not present

## 2019-05-05 DIAGNOSIS — Z0000 Encounter for general adult medical examination without abnormal findings: Secondary | ICD-10-CM | POA: Diagnosis not present

## 2019-05-05 DIAGNOSIS — I10 Essential (primary) hypertension: Secondary | ICD-10-CM | POA: Diagnosis not present

## 2019-05-05 DIAGNOSIS — Z1389 Encounter for screening for other disorder: Secondary | ICD-10-CM | POA: Diagnosis not present

## 2019-05-05 DIAGNOSIS — E119 Type 2 diabetes mellitus without complications: Secondary | ICD-10-CM | POA: Diagnosis not present

## 2019-05-05 DIAGNOSIS — E782 Mixed hyperlipidemia: Secondary | ICD-10-CM | POA: Diagnosis not present

## 2019-05-05 DIAGNOSIS — E1165 Type 2 diabetes mellitus with hyperglycemia: Secondary | ICD-10-CM | POA: Diagnosis not present

## 2019-05-05 DIAGNOSIS — Z6828 Body mass index (BMI) 28.0-28.9, adult: Secondary | ICD-10-CM | POA: Diagnosis not present

## 2019-07-07 DIAGNOSIS — M810 Age-related osteoporosis without current pathological fracture: Secondary | ICD-10-CM | POA: Diagnosis not present

## 2019-07-07 DIAGNOSIS — M8588 Other specified disorders of bone density and structure, other site: Secondary | ICD-10-CM | POA: Diagnosis not present

## 2019-07-14 DIAGNOSIS — H2513 Age-related nuclear cataract, bilateral: Secondary | ICD-10-CM | POA: Diagnosis not present

## 2019-07-14 DIAGNOSIS — H401130 Primary open-angle glaucoma, bilateral, stage unspecified: Secondary | ICD-10-CM | POA: Diagnosis not present

## 2019-07-14 DIAGNOSIS — E13319 Other specified diabetes mellitus with unspecified diabetic retinopathy without macular edema: Secondary | ICD-10-CM | POA: Diagnosis not present

## 2019-08-04 DIAGNOSIS — Z6828 Body mass index (BMI) 28.0-28.9, adult: Secondary | ICD-10-CM | POA: Diagnosis not present

## 2019-08-04 DIAGNOSIS — F411 Generalized anxiety disorder: Secondary | ICD-10-CM | POA: Diagnosis not present

## 2019-08-04 DIAGNOSIS — E782 Mixed hyperlipidemia: Secondary | ICD-10-CM | POA: Diagnosis not present

## 2019-08-04 DIAGNOSIS — I10 Essential (primary) hypertension: Secondary | ICD-10-CM | POA: Diagnosis not present

## 2019-08-04 DIAGNOSIS — E1165 Type 2 diabetes mellitus with hyperglycemia: Secondary | ICD-10-CM | POA: Diagnosis not present

## 2019-08-11 DIAGNOSIS — H2513 Age-related nuclear cataract, bilateral: Secondary | ICD-10-CM | POA: Diagnosis not present

## 2019-08-11 DIAGNOSIS — H402233 Chronic angle-closure glaucoma, bilateral, severe stage: Secondary | ICD-10-CM | POA: Diagnosis not present

## 2019-08-20 DIAGNOSIS — H52221 Regular astigmatism, right eye: Secondary | ICD-10-CM | POA: Diagnosis not present

## 2019-08-20 DIAGNOSIS — H524 Presbyopia: Secondary | ICD-10-CM | POA: Diagnosis not present

## 2019-08-20 DIAGNOSIS — H402233 Chronic angle-closure glaucoma, bilateral, severe stage: Secondary | ICD-10-CM | POA: Diagnosis not present

## 2019-08-20 DIAGNOSIS — H5203 Hypermetropia, bilateral: Secondary | ICD-10-CM | POA: Diagnosis not present

## 2019-09-10 DIAGNOSIS — H402233 Chronic angle-closure glaucoma, bilateral, severe stage: Secondary | ICD-10-CM | POA: Diagnosis not present

## 2019-09-10 DIAGNOSIS — H2513 Age-related nuclear cataract, bilateral: Secondary | ICD-10-CM | POA: Diagnosis not present

## 2019-09-10 DIAGNOSIS — G464 Cerebellar stroke syndrome: Secondary | ICD-10-CM | POA: Diagnosis not present

## 2019-11-03 DIAGNOSIS — I10 Essential (primary) hypertension: Secondary | ICD-10-CM | POA: Diagnosis not present

## 2019-11-03 DIAGNOSIS — E1165 Type 2 diabetes mellitus with hyperglycemia: Secondary | ICD-10-CM | POA: Diagnosis not present

## 2019-11-03 DIAGNOSIS — F411 Generalized anxiety disorder: Secondary | ICD-10-CM | POA: Diagnosis not present

## 2019-11-03 DIAGNOSIS — E782 Mixed hyperlipidemia: Secondary | ICD-10-CM | POA: Diagnosis not present

## 2019-11-03 DIAGNOSIS — Z6828 Body mass index (BMI) 28.0-28.9, adult: Secondary | ICD-10-CM | POA: Diagnosis not present

## 2019-12-28 DIAGNOSIS — H409 Unspecified glaucoma: Secondary | ICD-10-CM | POA: Diagnosis not present

## 2019-12-28 DIAGNOSIS — H2512 Age-related nuclear cataract, left eye: Secondary | ICD-10-CM | POA: Diagnosis not present

## 2019-12-28 DIAGNOSIS — H401122 Primary open-angle glaucoma, left eye, moderate stage: Secondary | ICD-10-CM | POA: Diagnosis not present

## 2019-12-28 DIAGNOSIS — E119 Type 2 diabetes mellitus without complications: Secondary | ICD-10-CM | POA: Diagnosis not present

## 2019-12-28 DIAGNOSIS — H401132 Primary open-angle glaucoma, bilateral, moderate stage: Secondary | ICD-10-CM | POA: Diagnosis not present

## 2019-12-28 DIAGNOSIS — Z01818 Encounter for other preprocedural examination: Secondary | ICD-10-CM | POA: Diagnosis not present

## 2020-01-25 DIAGNOSIS — H409 Unspecified glaucoma: Secondary | ICD-10-CM | POA: Diagnosis not present

## 2020-01-25 DIAGNOSIS — H2511 Age-related nuclear cataract, right eye: Secondary | ICD-10-CM | POA: Diagnosis not present

## 2020-01-25 DIAGNOSIS — H401112 Primary open-angle glaucoma, right eye, moderate stage: Secondary | ICD-10-CM | POA: Diagnosis not present

## 2020-01-25 DIAGNOSIS — H401132 Primary open-angle glaucoma, bilateral, moderate stage: Secondary | ICD-10-CM | POA: Diagnosis not present

## 2020-02-11 DIAGNOSIS — Z6828 Body mass index (BMI) 28.0-28.9, adult: Secondary | ICD-10-CM | POA: Diagnosis not present

## 2020-02-11 DIAGNOSIS — E782 Mixed hyperlipidemia: Secondary | ICD-10-CM | POA: Diagnosis not present

## 2020-02-11 DIAGNOSIS — E1165 Type 2 diabetes mellitus with hyperglycemia: Secondary | ICD-10-CM | POA: Diagnosis not present

## 2020-02-11 DIAGNOSIS — I10 Essential (primary) hypertension: Secondary | ICD-10-CM | POA: Diagnosis not present

## 2020-02-11 DIAGNOSIS — Z0000 Encounter for general adult medical examination without abnormal findings: Secondary | ICD-10-CM | POA: Diagnosis not present

## 2020-02-11 DIAGNOSIS — F411 Generalized anxiety disorder: Secondary | ICD-10-CM | POA: Diagnosis not present

## 2020-05-16 DIAGNOSIS — E1169 Type 2 diabetes mellitus with other specified complication: Secondary | ICD-10-CM | POA: Diagnosis not present

## 2020-05-16 DIAGNOSIS — I10 Essential (primary) hypertension: Secondary | ICD-10-CM | POA: Diagnosis not present

## 2020-05-16 DIAGNOSIS — Z6827 Body mass index (BMI) 27.0-27.9, adult: Secondary | ICD-10-CM | POA: Diagnosis not present

## 2020-05-16 DIAGNOSIS — R69 Illness, unspecified: Secondary | ICD-10-CM | POA: Diagnosis not present

## 2020-05-16 DIAGNOSIS — Z1331 Encounter for screening for depression: Secondary | ICD-10-CM | POA: Diagnosis not present

## 2020-05-16 DIAGNOSIS — E782 Mixed hyperlipidemia: Secondary | ICD-10-CM | POA: Diagnosis not present

## 2020-05-16 DIAGNOSIS — Z0000 Encounter for general adult medical examination without abnormal findings: Secondary | ICD-10-CM | POA: Diagnosis not present

## 2020-06-07 DIAGNOSIS — E119 Type 2 diabetes mellitus without complications: Secondary | ICD-10-CM | POA: Diagnosis not present

## 2020-06-07 DIAGNOSIS — Z7984 Long term (current) use of oral hypoglycemic drugs: Secondary | ICD-10-CM | POA: Diagnosis not present

## 2020-06-07 DIAGNOSIS — Z882 Allergy status to sulfonamides status: Secondary | ICD-10-CM | POA: Diagnosis not present

## 2020-06-07 DIAGNOSIS — I10 Essential (primary) hypertension: Secondary | ICD-10-CM | POA: Diagnosis not present

## 2020-06-07 DIAGNOSIS — H409 Unspecified glaucoma: Secondary | ICD-10-CM | POA: Diagnosis not present

## 2020-06-21 DIAGNOSIS — S76212A Strain of adductor muscle, fascia and tendon of left thigh, initial encounter: Secondary | ICD-10-CM | POA: Diagnosis not present

## 2020-08-17 DIAGNOSIS — Z6827 Body mass index (BMI) 27.0-27.9, adult: Secondary | ICD-10-CM | POA: Diagnosis not present

## 2020-08-17 DIAGNOSIS — R69 Illness, unspecified: Secondary | ICD-10-CM | POA: Diagnosis not present

## 2020-08-17 DIAGNOSIS — Z1331 Encounter for screening for depression: Secondary | ICD-10-CM | POA: Diagnosis not present

## 2020-08-17 DIAGNOSIS — I10 Essential (primary) hypertension: Secondary | ICD-10-CM | POA: Diagnosis not present

## 2020-08-17 DIAGNOSIS — Z0000 Encounter for general adult medical examination without abnormal findings: Secondary | ICD-10-CM | POA: Diagnosis not present

## 2020-08-17 DIAGNOSIS — E782 Mixed hyperlipidemia: Secondary | ICD-10-CM | POA: Diagnosis not present

## 2020-08-17 DIAGNOSIS — E1169 Type 2 diabetes mellitus with other specified complication: Secondary | ICD-10-CM | POA: Diagnosis not present

## 2020-11-16 DIAGNOSIS — I10 Essential (primary) hypertension: Secondary | ICD-10-CM | POA: Diagnosis not present

## 2020-11-16 DIAGNOSIS — E782 Mixed hyperlipidemia: Secondary | ICD-10-CM | POA: Diagnosis not present

## 2020-11-16 DIAGNOSIS — Z6827 Body mass index (BMI) 27.0-27.9, adult: Secondary | ICD-10-CM | POA: Diagnosis not present

## 2020-11-16 DIAGNOSIS — E1169 Type 2 diabetes mellitus with other specified complication: Secondary | ICD-10-CM | POA: Diagnosis not present

## 2020-11-16 DIAGNOSIS — R69 Illness, unspecified: Secondary | ICD-10-CM | POA: Diagnosis not present

## 2021-03-06 DIAGNOSIS — R69 Illness, unspecified: Secondary | ICD-10-CM | POA: Diagnosis not present

## 2021-03-06 DIAGNOSIS — E782 Mixed hyperlipidemia: Secondary | ICD-10-CM | POA: Diagnosis not present

## 2021-03-06 DIAGNOSIS — E1169 Type 2 diabetes mellitus with other specified complication: Secondary | ICD-10-CM | POA: Diagnosis not present

## 2021-03-06 DIAGNOSIS — Z0000 Encounter for general adult medical examination without abnormal findings: Secondary | ICD-10-CM | POA: Diagnosis not present

## 2021-03-06 DIAGNOSIS — Z8673 Personal history of transient ischemic attack (TIA), and cerebral infarction without residual deficits: Secondary | ICD-10-CM | POA: Diagnosis not present

## 2021-03-06 DIAGNOSIS — I10 Essential (primary) hypertension: Secondary | ICD-10-CM | POA: Diagnosis not present

## 2021-03-06 DIAGNOSIS — Z6826 Body mass index (BMI) 26.0-26.9, adult: Secondary | ICD-10-CM | POA: Diagnosis not present

## 2021-06-06 DIAGNOSIS — Z6826 Body mass index (BMI) 26.0-26.9, adult: Secondary | ICD-10-CM | POA: Diagnosis not present

## 2021-06-06 DIAGNOSIS — R69 Illness, unspecified: Secondary | ICD-10-CM | POA: Diagnosis not present

## 2021-06-06 DIAGNOSIS — Z8673 Personal history of transient ischemic attack (TIA), and cerebral infarction without residual deficits: Secondary | ICD-10-CM | POA: Diagnosis not present

## 2021-06-06 DIAGNOSIS — I10 Essential (primary) hypertension: Secondary | ICD-10-CM | POA: Diagnosis not present

## 2021-06-06 DIAGNOSIS — E782 Mixed hyperlipidemia: Secondary | ICD-10-CM | POA: Diagnosis not present

## 2021-06-06 DIAGNOSIS — E1169 Type 2 diabetes mellitus with other specified complication: Secondary | ICD-10-CM | POA: Diagnosis not present

## 2021-09-12 DIAGNOSIS — E1169 Type 2 diabetes mellitus with other specified complication: Secondary | ICD-10-CM | POA: Diagnosis not present

## 2021-09-12 DIAGNOSIS — I10 Essential (primary) hypertension: Secondary | ICD-10-CM | POA: Diagnosis not present

## 2021-09-12 DIAGNOSIS — E782 Mixed hyperlipidemia: Secondary | ICD-10-CM | POA: Diagnosis not present

## 2021-09-12 DIAGNOSIS — R69 Illness, unspecified: Secondary | ICD-10-CM | POA: Diagnosis not present

## 2021-09-12 DIAGNOSIS — F411 Generalized anxiety disorder: Secondary | ICD-10-CM | POA: Diagnosis not present

## 2021-09-12 DIAGNOSIS — Z6826 Body mass index (BMI) 26.0-26.9, adult: Secondary | ICD-10-CM | POA: Diagnosis not present

## 2021-09-12 DIAGNOSIS — Z8673 Personal history of transient ischemic attack (TIA), and cerebral infarction without residual deficits: Secondary | ICD-10-CM | POA: Diagnosis not present

## 2021-09-12 DIAGNOSIS — Z0000 Encounter for general adult medical examination without abnormal findings: Secondary | ICD-10-CM | POA: Diagnosis not present

## 2021-12-26 DIAGNOSIS — Z6826 Body mass index (BMI) 26.0-26.9, adult: Secondary | ICD-10-CM | POA: Diagnosis not present

## 2021-12-26 DIAGNOSIS — E1169 Type 2 diabetes mellitus with other specified complication: Secondary | ICD-10-CM | POA: Diagnosis not present

## 2021-12-26 DIAGNOSIS — H814 Vertigo of central origin: Secondary | ICD-10-CM | POA: Diagnosis not present

## 2021-12-26 DIAGNOSIS — E119 Type 2 diabetes mellitus without complications: Secondary | ICD-10-CM | POA: Diagnosis not present

## 2021-12-26 DIAGNOSIS — R69 Illness, unspecified: Secondary | ICD-10-CM | POA: Diagnosis not present

## 2021-12-26 DIAGNOSIS — Z8673 Personal history of transient ischemic attack (TIA), and cerebral infarction without residual deficits: Secondary | ICD-10-CM | POA: Diagnosis not present

## 2021-12-26 DIAGNOSIS — E782 Mixed hyperlipidemia: Secondary | ICD-10-CM | POA: Diagnosis not present

## 2021-12-26 DIAGNOSIS — I10 Essential (primary) hypertension: Secondary | ICD-10-CM | POA: Diagnosis not present

## 2021-12-26 DIAGNOSIS — Z0000 Encounter for general adult medical examination without abnormal findings: Secondary | ICD-10-CM | POA: Diagnosis not present

## 2022-04-03 DIAGNOSIS — H814 Vertigo of central origin: Secondary | ICD-10-CM | POA: Diagnosis not present

## 2022-04-03 DIAGNOSIS — Z8673 Personal history of transient ischemic attack (TIA), and cerebral infarction without residual deficits: Secondary | ICD-10-CM | POA: Diagnosis not present

## 2022-04-03 DIAGNOSIS — E1169 Type 2 diabetes mellitus with other specified complication: Secondary | ICD-10-CM | POA: Diagnosis not present

## 2022-04-03 DIAGNOSIS — R69 Illness, unspecified: Secondary | ICD-10-CM | POA: Diagnosis not present

## 2022-04-03 DIAGNOSIS — I10 Essential (primary) hypertension: Secondary | ICD-10-CM | POA: Diagnosis not present

## 2022-04-03 DIAGNOSIS — E782 Mixed hyperlipidemia: Secondary | ICD-10-CM | POA: Diagnosis not present

## 2022-04-03 DIAGNOSIS — Z6826 Body mass index (BMI) 26.0-26.9, adult: Secondary | ICD-10-CM | POA: Diagnosis not present

## 2022-04-13 DIAGNOSIS — R82998 Other abnormal findings in urine: Secondary | ICD-10-CM | POA: Diagnosis not present

## 2022-07-10 DIAGNOSIS — R69 Illness, unspecified: Secondary | ICD-10-CM | POA: Diagnosis not present

## 2022-07-10 DIAGNOSIS — E1169 Type 2 diabetes mellitus with other specified complication: Secondary | ICD-10-CM | POA: Diagnosis not present

## 2022-07-10 DIAGNOSIS — I10 Essential (primary) hypertension: Secondary | ICD-10-CM | POA: Diagnosis not present

## 2022-07-10 DIAGNOSIS — E0843 Diabetes mellitus due to underlying condition with diabetic autonomic (poly)neuropathy: Secondary | ICD-10-CM | POA: Diagnosis not present

## 2022-07-10 DIAGNOSIS — Z8673 Personal history of transient ischemic attack (TIA), and cerebral infarction without residual deficits: Secondary | ICD-10-CM | POA: Diagnosis not present

## 2022-07-10 DIAGNOSIS — H814 Vertigo of central origin: Secondary | ICD-10-CM | POA: Diagnosis not present

## 2022-07-10 DIAGNOSIS — Z6825 Body mass index (BMI) 25.0-25.9, adult: Secondary | ICD-10-CM | POA: Diagnosis not present

## 2022-07-10 DIAGNOSIS — F411 Generalized anxiety disorder: Secondary | ICD-10-CM | POA: Diagnosis not present

## 2022-07-10 DIAGNOSIS — E1143 Type 2 diabetes mellitus with diabetic autonomic (poly)neuropathy: Secondary | ICD-10-CM | POA: Diagnosis not present

## 2022-07-10 DIAGNOSIS — E782 Mixed hyperlipidemia: Secondary | ICD-10-CM | POA: Diagnosis not present

## 2022-10-10 DIAGNOSIS — H814 Vertigo of central origin: Secondary | ICD-10-CM | POA: Diagnosis not present

## 2022-10-10 DIAGNOSIS — Z8673 Personal history of transient ischemic attack (TIA), and cerebral infarction without residual deficits: Secondary | ICD-10-CM | POA: Diagnosis not present

## 2022-10-10 DIAGNOSIS — E782 Mixed hyperlipidemia: Secondary | ICD-10-CM | POA: Diagnosis not present

## 2022-10-10 DIAGNOSIS — Z0000 Encounter for general adult medical examination without abnormal findings: Secondary | ICD-10-CM | POA: Diagnosis not present

## 2022-10-10 DIAGNOSIS — R69 Illness, unspecified: Secondary | ICD-10-CM | POA: Diagnosis not present

## 2022-10-10 DIAGNOSIS — I10 Essential (primary) hypertension: Secondary | ICD-10-CM | POA: Diagnosis not present

## 2022-10-10 DIAGNOSIS — Z6825 Body mass index (BMI) 25.0-25.9, adult: Secondary | ICD-10-CM | POA: Diagnosis not present

## 2022-10-10 DIAGNOSIS — E1143 Type 2 diabetes mellitus with diabetic autonomic (poly)neuropathy: Secondary | ICD-10-CM | POA: Diagnosis not present

## 2023-01-05 DIAGNOSIS — H5203 Hypermetropia, bilateral: Secondary | ICD-10-CM | POA: Diagnosis not present

## 2023-01-05 DIAGNOSIS — H52229 Regular astigmatism, unspecified eye: Secondary | ICD-10-CM | POA: Diagnosis not present

## 2023-01-05 DIAGNOSIS — H5213 Myopia, bilateral: Secondary | ICD-10-CM | POA: Diagnosis not present

## 2023-01-05 DIAGNOSIS — H524 Presbyopia: Secondary | ICD-10-CM | POA: Diagnosis not present

## 2023-01-09 DIAGNOSIS — F330 Major depressive disorder, recurrent, mild: Secondary | ICD-10-CM | POA: Diagnosis not present

## 2023-01-09 DIAGNOSIS — N3091 Cystitis, unspecified with hematuria: Secondary | ICD-10-CM | POA: Diagnosis not present

## 2023-01-09 DIAGNOSIS — Z8673 Personal history of transient ischemic attack (TIA), and cerebral infarction without residual deficits: Secondary | ICD-10-CM | POA: Diagnosis not present

## 2023-01-09 DIAGNOSIS — Z6825 Body mass index (BMI) 25.0-25.9, adult: Secondary | ICD-10-CM | POA: Diagnosis not present

## 2023-01-09 DIAGNOSIS — H814 Vertigo of central origin: Secondary | ICD-10-CM | POA: Diagnosis not present

## 2023-01-09 DIAGNOSIS — E782 Mixed hyperlipidemia: Secondary | ICD-10-CM | POA: Diagnosis not present

## 2023-01-09 DIAGNOSIS — F411 Generalized anxiety disorder: Secondary | ICD-10-CM | POA: Diagnosis not present

## 2023-01-09 DIAGNOSIS — E1143 Type 2 diabetes mellitus with diabetic autonomic (poly)neuropathy: Secondary | ICD-10-CM | POA: Diagnosis not present

## 2023-01-09 DIAGNOSIS — Z0000 Encounter for general adult medical examination without abnormal findings: Secondary | ICD-10-CM | POA: Diagnosis not present

## 2023-01-09 DIAGNOSIS — I10 Essential (primary) hypertension: Secondary | ICD-10-CM | POA: Diagnosis not present

## 2023-05-02 DIAGNOSIS — Z8673 Personal history of transient ischemic attack (TIA), and cerebral infarction without residual deficits: Secondary | ICD-10-CM | POA: Diagnosis not present

## 2023-05-02 DIAGNOSIS — E1143 Type 2 diabetes mellitus with diabetic autonomic (poly)neuropathy: Secondary | ICD-10-CM | POA: Diagnosis not present

## 2023-05-02 DIAGNOSIS — Z6826 Body mass index (BMI) 26.0-26.9, adult: Secondary | ICD-10-CM | POA: Diagnosis not present

## 2023-05-02 DIAGNOSIS — F331 Major depressive disorder, recurrent, moderate: Secondary | ICD-10-CM | POA: Diagnosis not present

## 2023-05-02 DIAGNOSIS — E782 Mixed hyperlipidemia: Secondary | ICD-10-CM | POA: Diagnosis not present

## 2023-05-02 DIAGNOSIS — I10 Essential (primary) hypertension: Secondary | ICD-10-CM | POA: Diagnosis not present

## 2023-05-02 DIAGNOSIS — B351 Tinea unguium: Secondary | ICD-10-CM | POA: Diagnosis not present

## 2023-05-02 DIAGNOSIS — N182 Chronic kidney disease, stage 2 (mild): Secondary | ICD-10-CM | POA: Diagnosis not present

## 2023-05-02 DIAGNOSIS — F411 Generalized anxiety disorder: Secondary | ICD-10-CM | POA: Diagnosis not present

## 2023-05-31 DIAGNOSIS — E782 Mixed hyperlipidemia: Secondary | ICD-10-CM | POA: Diagnosis not present

## 2023-05-31 DIAGNOSIS — F331 Major depressive disorder, recurrent, moderate: Secondary | ICD-10-CM | POA: Diagnosis not present

## 2023-05-31 DIAGNOSIS — E1143 Type 2 diabetes mellitus with diabetic autonomic (poly)neuropathy: Secondary | ICD-10-CM | POA: Diagnosis not present

## 2023-05-31 DIAGNOSIS — I10 Essential (primary) hypertension: Secondary | ICD-10-CM | POA: Diagnosis not present

## 2023-05-31 DIAGNOSIS — N182 Chronic kidney disease, stage 2 (mild): Secondary | ICD-10-CM | POA: Diagnosis not present

## 2023-07-05 DIAGNOSIS — I10 Essential (primary) hypertension: Secondary | ICD-10-CM | POA: Diagnosis not present

## 2023-07-05 DIAGNOSIS — E1143 Type 2 diabetes mellitus with diabetic autonomic (poly)neuropathy: Secondary | ICD-10-CM | POA: Diagnosis not present

## 2023-07-05 DIAGNOSIS — E782 Mixed hyperlipidemia: Secondary | ICD-10-CM | POA: Diagnosis not present

## 2023-08-05 DIAGNOSIS — I10 Essential (primary) hypertension: Secondary | ICD-10-CM | POA: Diagnosis not present

## 2023-08-05 DIAGNOSIS — E1143 Type 2 diabetes mellitus with diabetic autonomic (poly)neuropathy: Secondary | ICD-10-CM | POA: Diagnosis not present

## 2023-08-05 DIAGNOSIS — E782 Mixed hyperlipidemia: Secondary | ICD-10-CM | POA: Diagnosis not present

## 2023-08-15 DIAGNOSIS — B351 Tinea unguium: Secondary | ICD-10-CM | POA: Diagnosis not present

## 2023-08-15 DIAGNOSIS — N182 Chronic kidney disease, stage 2 (mild): Secondary | ICD-10-CM | POA: Diagnosis not present

## 2023-08-15 DIAGNOSIS — E1122 Type 2 diabetes mellitus with diabetic chronic kidney disease: Secondary | ICD-10-CM | POA: Diagnosis not present

## 2023-08-15 DIAGNOSIS — E1143 Type 2 diabetes mellitus with diabetic autonomic (poly)neuropathy: Secondary | ICD-10-CM | POA: Diagnosis not present

## 2023-08-15 DIAGNOSIS — F411 Generalized anxiety disorder: Secondary | ICD-10-CM | POA: Diagnosis not present

## 2023-08-15 DIAGNOSIS — Z8673 Personal history of transient ischemic attack (TIA), and cerebral infarction without residual deficits: Secondary | ICD-10-CM | POA: Diagnosis not present

## 2023-08-15 DIAGNOSIS — M62830 Muscle spasm of back: Secondary | ICD-10-CM | POA: Diagnosis not present

## 2023-08-15 DIAGNOSIS — H814 Vertigo of central origin: Secondary | ICD-10-CM | POA: Diagnosis not present

## 2023-08-15 DIAGNOSIS — Z0000 Encounter for general adult medical examination without abnormal findings: Secondary | ICD-10-CM | POA: Diagnosis not present

## 2023-08-15 DIAGNOSIS — I10 Essential (primary) hypertension: Secondary | ICD-10-CM | POA: Diagnosis not present

## 2023-08-15 DIAGNOSIS — F331 Major depressive disorder, recurrent, moderate: Secondary | ICD-10-CM | POA: Diagnosis not present

## 2023-08-15 DIAGNOSIS — E782 Mixed hyperlipidemia: Secondary | ICD-10-CM | POA: Diagnosis not present

## 2023-08-15 DIAGNOSIS — Z6825 Body mass index (BMI) 25.0-25.9, adult: Secondary | ICD-10-CM | POA: Diagnosis not present

## 2023-08-23 DIAGNOSIS — E038 Other specified hypothyroidism: Secondary | ICD-10-CM | POA: Diagnosis not present

## 2023-11-14 DIAGNOSIS — I10 Essential (primary) hypertension: Secondary | ICD-10-CM | POA: Diagnosis not present

## 2023-11-14 DIAGNOSIS — H814 Vertigo of central origin: Secondary | ICD-10-CM | POA: Diagnosis not present

## 2023-11-14 DIAGNOSIS — F411 Generalized anxiety disorder: Secondary | ICD-10-CM | POA: Diagnosis not present

## 2023-11-14 DIAGNOSIS — Z0000 Encounter for general adult medical examination without abnormal findings: Secondary | ICD-10-CM | POA: Diagnosis not present

## 2023-11-14 DIAGNOSIS — M62830 Muscle spasm of back: Secondary | ICD-10-CM | POA: Diagnosis not present

## 2023-11-14 DIAGNOSIS — E1122 Type 2 diabetes mellitus with diabetic chronic kidney disease: Secondary | ICD-10-CM | POA: Diagnosis not present

## 2023-11-14 DIAGNOSIS — F331 Major depressive disorder, recurrent, moderate: Secondary | ICD-10-CM | POA: Diagnosis not present

## 2023-11-14 DIAGNOSIS — E782 Mixed hyperlipidemia: Secondary | ICD-10-CM | POA: Diagnosis not present

## 2023-11-14 DIAGNOSIS — Z6825 Body mass index (BMI) 25.0-25.9, adult: Secondary | ICD-10-CM | POA: Diagnosis not present

## 2023-11-14 DIAGNOSIS — Z8673 Personal history of transient ischemic attack (TIA), and cerebral infarction without residual deficits: Secondary | ICD-10-CM | POA: Diagnosis not present

## 2023-11-14 DIAGNOSIS — N1831 Chronic kidney disease, stage 3a: Secondary | ICD-10-CM | POA: Diagnosis not present

## 2023-12-11 DIAGNOSIS — F331 Major depressive disorder, recurrent, moderate: Secondary | ICD-10-CM | POA: Diagnosis not present

## 2023-12-11 DIAGNOSIS — I10 Essential (primary) hypertension: Secondary | ICD-10-CM | POA: Diagnosis not present

## 2024-01-08 DIAGNOSIS — I10 Essential (primary) hypertension: Secondary | ICD-10-CM | POA: Diagnosis not present

## 2024-01-08 DIAGNOSIS — F331 Major depressive disorder, recurrent, moderate: Secondary | ICD-10-CM | POA: Diagnosis not present

## 2024-01-09 DIAGNOSIS — Z6825 Body mass index (BMI) 25.0-25.9, adult: Secondary | ICD-10-CM | POA: Diagnosis not present

## 2024-01-09 DIAGNOSIS — N3091 Cystitis, unspecified with hematuria: Secondary | ICD-10-CM | POA: Diagnosis not present

## 2024-02-06 DIAGNOSIS — I10 Essential (primary) hypertension: Secondary | ICD-10-CM | POA: Diagnosis not present

## 2024-02-06 DIAGNOSIS — F331 Major depressive disorder, recurrent, moderate: Secondary | ICD-10-CM | POA: Diagnosis not present

## 2024-02-13 DIAGNOSIS — N1831 Chronic kidney disease, stage 3a: Secondary | ICD-10-CM | POA: Diagnosis not present

## 2024-02-13 DIAGNOSIS — Z8673 Personal history of transient ischemic attack (TIA), and cerebral infarction without residual deficits: Secondary | ICD-10-CM | POA: Diagnosis not present

## 2024-02-13 DIAGNOSIS — E782 Mixed hyperlipidemia: Secondary | ICD-10-CM | POA: Diagnosis not present

## 2024-02-13 DIAGNOSIS — H814 Vertigo of central origin: Secondary | ICD-10-CM | POA: Diagnosis not present

## 2024-02-13 DIAGNOSIS — F411 Generalized anxiety disorder: Secondary | ICD-10-CM | POA: Diagnosis not present

## 2024-02-13 DIAGNOSIS — I10 Essential (primary) hypertension: Secondary | ICD-10-CM | POA: Diagnosis not present

## 2024-02-13 DIAGNOSIS — F331 Major depressive disorder, recurrent, moderate: Secondary | ICD-10-CM | POA: Diagnosis not present

## 2024-02-13 DIAGNOSIS — E1122 Type 2 diabetes mellitus with diabetic chronic kidney disease: Secondary | ICD-10-CM | POA: Diagnosis not present

## 2024-02-13 DIAGNOSIS — Z6825 Body mass index (BMI) 25.0-25.9, adult: Secondary | ICD-10-CM | POA: Diagnosis not present

## 2024-03-06 DIAGNOSIS — I10 Essential (primary) hypertension: Secondary | ICD-10-CM | POA: Diagnosis not present

## 2024-03-06 DIAGNOSIS — F331 Major depressive disorder, recurrent, moderate: Secondary | ICD-10-CM | POA: Diagnosis not present

## 2024-04-07 DIAGNOSIS — I10 Essential (primary) hypertension: Secondary | ICD-10-CM | POA: Diagnosis not present

## 2024-04-07 DIAGNOSIS — F331 Major depressive disorder, recurrent, moderate: Secondary | ICD-10-CM | POA: Diagnosis not present

## 2024-05-11 DIAGNOSIS — I10 Essential (primary) hypertension: Secondary | ICD-10-CM | POA: Diagnosis not present

## 2024-05-11 DIAGNOSIS — F331 Major depressive disorder, recurrent, moderate: Secondary | ICD-10-CM | POA: Diagnosis not present

## 2024-06-01 DIAGNOSIS — I10 Essential (primary) hypertension: Secondary | ICD-10-CM | POA: Diagnosis not present

## 2024-06-01 DIAGNOSIS — E1122 Type 2 diabetes mellitus with diabetic chronic kidney disease: Secondary | ICD-10-CM | POA: Diagnosis not present

## 2024-06-01 DIAGNOSIS — F331 Major depressive disorder, recurrent, moderate: Secondary | ICD-10-CM | POA: Diagnosis not present

## 2024-06-01 DIAGNOSIS — N182 Chronic kidney disease, stage 2 (mild): Secondary | ICD-10-CM | POA: Diagnosis not present

## 2024-06-01 DIAGNOSIS — Z8673 Personal history of transient ischemic attack (TIA), and cerebral infarction without residual deficits: Secondary | ICD-10-CM | POA: Diagnosis not present

## 2024-06-01 DIAGNOSIS — F411 Generalized anxiety disorder: Secondary | ICD-10-CM | POA: Diagnosis not present

## 2024-06-01 DIAGNOSIS — E782 Mixed hyperlipidemia: Secondary | ICD-10-CM | POA: Diagnosis not present

## 2024-06-01 DIAGNOSIS — H814 Vertigo of central origin: Secondary | ICD-10-CM | POA: Diagnosis not present

## 2024-06-01 DIAGNOSIS — Z6825 Body mass index (BMI) 25.0-25.9, adult: Secondary | ICD-10-CM | POA: Diagnosis not present

## 2024-06-11 DIAGNOSIS — I10 Essential (primary) hypertension: Secondary | ICD-10-CM | POA: Diagnosis not present

## 2024-06-11 DIAGNOSIS — F331 Major depressive disorder, recurrent, moderate: Secondary | ICD-10-CM | POA: Diagnosis not present

## 2024-06-11 NOTE — Progress Notes (Signed)
 Alyssa Ashley                                          MRN: [id number]   06/11/2024   The [facility] Quality Team Specialist reviewed this patient medical record for the purposes of chart review for care gap closure. The following were reviewed: chart review for care gap closure-kidney health evaluation for diabetes:uACR.    [facility] Quality Team

## 2024-07-09 DIAGNOSIS — I10 Essential (primary) hypertension: Secondary | ICD-10-CM | POA: Diagnosis not present

## 2024-07-09 DIAGNOSIS — F331 Major depressive disorder, recurrent, moderate: Secondary | ICD-10-CM | POA: Diagnosis not present

## 2024-09-04 NOTE — Progress Notes (Signed)
 LATEKA RADY                                          MRN: [id number]   09/04/2024   The [facility] Quality Team Specialist reviewed this patient medical record for the purposes of chart review for care gap closure. The following were reviewed: chart review for care gap closure-controlling blood pressure.    [facility]BCI Quality Team
